# Patient Record
Sex: Male | Born: 1990 | Race: Black or African American | Hispanic: No | Marital: Single | Smoking: Current every day smoker
Health system: Southern US, Community
[De-identification: ages and names within clinical notes are randomized; demographics above are authoritative.]

## PROBLEM LIST (undated history)

## (undated) ENCOUNTER — Ambulatory Visit (HOSPITAL_COMMUNITY): Payer: Managed Care, Other (non HMO)

## (undated) DIAGNOSIS — I10 Essential (primary) hypertension: Secondary | ICD-10-CM

## (undated) DIAGNOSIS — B009 Herpesviral infection, unspecified: Secondary | ICD-10-CM

## (undated) DIAGNOSIS — F419 Anxiety disorder, unspecified: Secondary | ICD-10-CM

## (undated) DIAGNOSIS — F410 Panic disorder [episodic paroxysmal anxiety] without agoraphobia: Secondary | ICD-10-CM

## (undated) HISTORY — DX: Anxiety disorder, unspecified: F41.9

## (undated) HISTORY — DX: Essential (primary) hypertension: I10

---

## 2011-11-06 ENCOUNTER — Ambulatory Visit (INDEPENDENT_AMBULATORY_CARE_PROVIDER_SITE_OTHER): Payer: 59 | Admitting: Physician Assistant

## 2011-11-06 VITALS — BP 108/88 | HR 72 | Temp 97.7°F | Resp 16 | Ht 74.0 in | Wt 172.0 lb

## 2011-11-06 DIAGNOSIS — Z7251 High risk heterosexual behavior: Secondary | ICD-10-CM

## 2011-11-06 NOTE — Progress Notes (Signed)
  Subjective:    Patient ID: Joshua Silva, male    DOB: Mar 23, 1991, 21 y.o.   MRN: [id number]  HPI Pt presents to clinic for STD screening.  He requests a full STD panel.  He currently does not have a partner but recently had a new exposure that he was to make sure he is ok from.  He has had ~15 lifetime male partners and always uses condoms.  He is currently having no symptoms  Review of Systems     Objective:   Physical Exam  Constitutional: He is oriented to person, place, and time. He appears well-developed and well-nourished.  HENT:  Head: Normocephalic and atraumatic.  Right Ear: External ear normal.  Left Ear: External ear normal.  Nose: Nose normal.  Eyes: Conjunctivae are normal.  Pulmonary/Chest: Effort normal.  Neurological: He is alert and oriented to person, place, and time.  Skin: Skin is warm and dry.  Psychiatric: He has a normal mood and affect. His behavior is normal. Judgment and thought content normal.       Assessment & Plan:   1. Problems related to high-risk sexual behavior  Hepatitis C antibody, Hepatitis B surface antigen, Hepatitis B surface antibody, HIV antibody, RPR, HSV 2 antibody, IgG, GC/chlamydia probe amp, urine  Pt to continue with safe sex.  Will call him with results.

## 2011-11-07 LAB — HEPATITIS B SURFACE ANTIBODY, QUANTITATIVE: Hepatitis B-Post: 0 m[IU]/mL

## 2011-11-07 LAB — HEPATITIS C ANTIBODY: HCV Ab: NEGATIVE

## 2011-11-07 LAB — HEPATITIS B SURFACE ANTIGEN: Hepatitis B Surface Ag: NEGATIVE

## 2011-11-07 LAB — HIV ANTIBODY (ROUTINE TESTING W REFLEX): HIV: NONREACTIVE

## 2011-11-08 LAB — GC/CHLAMYDIA PROBE AMP, URINE: GC Probe Amp, Urine: NEGATIVE

## 2011-11-08 LAB — RPR

## 2011-11-09 ENCOUNTER — Other Ambulatory Visit: Payer: Self-pay | Admitting: Physician Assistant

## 2011-11-09 MED ORDER — AZITHROMYCIN 500 MG PO TABS: 1000.0000 mg | ORAL_TABLET | Freq: Every day | ORAL | Status: AC

## 2013-05-14 ENCOUNTER — Encounter (HOSPITAL_COMMUNITY): Payer: Self-pay | Admitting: Emergency Medicine

## 2013-05-14 ENCOUNTER — Emergency Department (HOSPITAL_COMMUNITY)
Admission: EM | Admit: 2013-05-14 | Discharge: 2013-05-14 | Disposition: A | Discharge status: Home / Self Care | Payer: 59 | Attending: Emergency Medicine | Admitting: Emergency Medicine

## 2013-05-14 DIAGNOSIS — R110 Nausea: Secondary | ICD-10-CM | POA: Insufficient documentation

## 2013-05-14 DIAGNOSIS — B009 Herpesviral infection, unspecified: Secondary | ICD-10-CM | POA: Insufficient documentation

## 2013-05-14 DIAGNOSIS — F17200 Nicotine dependence, unspecified, uncomplicated: Secondary | ICD-10-CM | POA: Insufficient documentation

## 2013-05-14 HISTORY — DX: Herpesviral infection, unspecified: B00.9

## 2013-05-14 LAB — CBC WITH DIFFERENTIAL/PLATELET
Basophils Absolute: 0 10*3/uL (ref 0.0–0.1)
Basophils Relative: 0 % (ref 0–1)
EOS ABS: 0.2 10*3/uL (ref 0.0–0.7)
EOS PCT: 5 % (ref 0–5)
HCT: 35.7 % — ABNORMAL LOW (ref 39.0–52.0)
HEMOGLOBIN: 11.8 g/dL — AB (ref 13.0–17.0)
LYMPHS ABS: 2.1 10*3/uL (ref 0.7–4.0)
Lymphocytes Relative: 52 % — ABNORMAL HIGH (ref 12–46)
MCH: 26.3 pg (ref 26.0–34.0)
MCHC: 33.1 g/dL (ref 30.0–36.0)
MCV: 79.5 fL (ref 78.0–100.0)
MONOS PCT: 5 % (ref 3–12)
Monocytes Absolute: 0.2 10*3/uL (ref 0.1–1.0)
Neutro Abs: 1.6 10*3/uL — ABNORMAL LOW (ref 1.7–7.7)
Neutrophils Relative %: 38 % — ABNORMAL LOW (ref 43–77)
Platelets: 158 10*3/uL (ref 150–400)
RBC: 4.49 MIL/uL (ref 4.22–5.81)
RDW: 13.2 % (ref 11.5–15.5)
WBC: 4.1 10*3/uL (ref 4.0–10.5)

## 2013-05-14 LAB — BASIC METABOLIC PANEL
BUN: 10 mg/dL (ref 6–23)
CALCIUM: 9.1 mg/dL (ref 8.4–10.5)
CO2: 24 meq/L (ref 19–32)
Chloride: 106 mEq/L (ref 96–112)
Creatinine, Ser: 0.94 mg/dL (ref 0.50–1.35)
GFR calc Af Amer: >90 mL/min (ref >90)
GLUCOSE: 90 mg/dL (ref 70–99)
Potassium: 3.5 mEq/L — ABNORMAL LOW (ref 3.7–5.3)
Sodium: 143 mEq/L (ref 137–147)

## 2013-05-14 MED ORDER — ONDANSETRON 4 MG PO TBDP
4.0000 mg | ORAL_TABLET | Freq: Once | ORAL | Status: AC
  Administered 2013-05-14: 4 mg via ORAL
  Filled 2013-05-14: qty 1

## 2013-05-14 NOTE — ED Notes (Signed)
Pt reports nausea x 1week. Pt reports that he believes this nausea is due to taking his herpes medications which he is unable to remember the names of at this time. NAD at this time.

## 2013-05-14 NOTE — Discharge Instructions (Signed)
Take zofran as needed for nausea. Refer to attached documents for more information. Return to the ED with worsening or concerning symptoms.  °

## 2013-05-14 NOTE — ED Provider Notes (Signed)
CSN: [id number]     Arrival date & time 05/14/13  0534 History   First MD Initiated Contact with Patient 05/14/13 [phone]     Chief Complaint  Patient presents with  . Nausea   (Consider location/radiation/quality/duration/timing/severity/associated sxs/prior Treatment) HPI Comments: Patient is a 23 year old male with a past medical history of herpes who presents with nausea for the past 2 weeks. Symptoms started gradually and remained intermittent since the onset. Patient reports thinking his Valtrex was making him feel nauseous. He stopped taking the medication 1 week ago and continues to report intermittent nausea. He reports having a zofran prescription at home but he has not taken any. No aggravating/alleviating factors. Patient reports normal bowel movement and denies vomiting. No other associated symptoms. Patient denies history of abdominal surgery.    Past Medical History  Diagnosis Date  . Herpes    History reviewed. No pertinent past surgical history. No family history on file. History  Substance Use Topics  . Smoking status: Current Every Day Smoker -- 0.25 packs/day    Types: Cigarettes  . Smokeless tobacco: Not on file  . Alcohol Use: No    Review of Systems  Constitutional: Negative for fever, chills and fatigue.  HENT: Negative for trouble swallowing.   Eyes: Negative for visual disturbance.  Respiratory: Negative for shortness of breath.   Cardiovascular: Negative for chest pain and palpitations.  Gastrointestinal: Positive for nausea. Negative for vomiting, abdominal pain and diarrhea.  Genitourinary: Negative for dysuria and difficulty urinating.  Musculoskeletal: Negative for arthralgias and neck pain.  Skin: Negative for color change.  Neurological: Negative for dizziness and weakness.  Psychiatric/Behavioral: Negative for dysphoric mood.    Allergies  Review of patient's allergies indicates no known allergies.  Home Medications   Current Outpatient Rx   Name  Route  Sig  Dispense  Refill  . doxycycline (VIBRA-TABS) 100 MG tablet   Oral   Take 100 mg by mouth daily as needed (for herbes outbreak).         . valACYclovir (VALTREX) 500 MG tablet   Oral   Take 500 mg by mouth daily as needed (for herbes outbreak).          BP 129/81  Pulse 56  Temp(Src) 98 F (36.7 C) (Oral)  Resp 18  Ht 6\' 2"  (1.88 m)  Wt 172 lb (78.019 kg)  BMI 22.07 kg/m2  SpO2 100% Physical Exam  Nursing note and vitals reviewed. Constitutional: He appears well-developed and well-nourished. No distress.  HENT:  Head: Normocephalic and atraumatic.  Eyes: Conjunctivae and EOM are normal.  Neck: Normal range of motion.  Cardiovascular: Normal rate and regular rhythm.  Exam reveals no gallop and no friction rub.   No murmur heard. Pulmonary/Chest: Effort normal and breath sounds normal. He has no wheezes. He has no rales. He exhibits no tenderness.  Abdominal: Soft. He exhibits no distension. There is no tenderness. There is no rebound and no guarding.  No abdominal tenderness to palpation.   Musculoskeletal: Normal range of motion.  Neurological: He is alert. Coordination normal.  Speech is goal-oriented. Moves limbs without ataxia.   Skin: Skin is warm and dry.  Psychiatric: He has a normal mood and affect. His behavior is normal.    ED Course  Procedures (including critical care time) Labs Review Labs Reviewed  CBC WITH DIFFERENTIAL - Abnormal; Notable for the following:    Hemoglobin 11.8 (*)    HCT 35.7 (*)    Neutrophils Relative %  38 (*)    Neutro Abs 1.6 (*)    Lymphocytes Relative 52 (*)    All other components within normal limits  BASIC METABOLIC PANEL - Abnormal; Notable for the following:    Potassium 3.5 (*)    All other components within normal limits   Imaging Review No results found.  EKG Interpretation   None       MDM   1. Nausea     7:43 AM Patient's labs unremarkable for acute changes. Patient has no  abdominal tenderness to palpation. Vitals stable and patient afebrile. I doubt acute infectious or life threatening etiology at this time. Patient reports zofran here made him feel better. Patient has a prescription for zofran at home. Patient advised to return with worsening or concerning symptoms.     Emilia Beck, [place] 05/14/13 [phone]

## 2013-05-14 NOTE — ED Provider Notes (Signed)
Medical screening examination/treatment/procedure(s) were performed by non-physician practitioner and as supervising physician I was immediately available for consultation/collaboration.  EKG Interpretation   None        Ethelda ChickMartha K Linker, MD 05/14/13 2300

## 2013-05-27 ENCOUNTER — Other Ambulatory Visit: Payer: Self-pay | Admitting: Family Medicine

## 2013-05-27 DIAGNOSIS — R109 Unspecified abdominal pain: Secondary | ICD-10-CM

## 2013-05-30 ENCOUNTER — Other Ambulatory Visit: Payer: Self-pay

## 2013-06-04 ENCOUNTER — Other Ambulatory Visit: Payer: Self-pay

## 2013-06-06 ENCOUNTER — Ambulatory Visit
Admission: RE | Admit: 2013-06-06 | Discharge: 2013-06-06 | Disposition: A | Discharge status: Home / Self Care | Payer: 59 | Source: Ambulatory Visit | Attending: Family Medicine | Admitting: Family Medicine

## 2013-06-06 DIAGNOSIS — R109 Unspecified abdominal pain: Secondary | ICD-10-CM

## 2013-06-06 MED ORDER — IOHEXOL 300 MG/ML  SOLN
100.0000 mL | Freq: Once | INTRAMUSCULAR | Status: AC | PRN
  Administered 2013-06-06: 100 mL via INTRAVENOUS

## 2013-06-07 ENCOUNTER — Encounter (HOSPITAL_COMMUNITY): Payer: Self-pay | Admitting: Emergency Medicine

## 2013-06-07 DIAGNOSIS — F17200 Nicotine dependence, unspecified, uncomplicated: Secondary | ICD-10-CM | POA: Insufficient documentation

## 2013-06-07 DIAGNOSIS — R110 Nausea: Secondary | ICD-10-CM | POA: Insufficient documentation

## 2013-06-07 MED ORDER — ONDANSETRON 4 MG PO TBDP
8.0000 mg | ORAL_TABLET | Freq: Once | ORAL | Status: AC
  Administered 2013-06-07: 8 mg via ORAL
  Filled 2013-06-07: qty 2

## 2013-06-07 NOTE — ED Notes (Addendum)
C/o nausea since the end of January.  States he has been seen for same and had negative CT scan.  Denies abd pain at present.  Denies vomiting.  States he is taking Omeprazole without relief of symptoms.  Pt 's discharge instructions stated to take Zofran for nausea but pt states he did not receive a Rx for Zofran.  Also taking medication for herpes that he believes may be causing the nausea.

## 2013-06-08 ENCOUNTER — Emergency Department (HOSPITAL_COMMUNITY)
Admission: EM | Admit: 2013-06-08 | Discharge: 2013-06-08 | Discharge status: Against Medical Advice | Payer: 59 | Attending: Emergency Medicine | Admitting: Emergency Medicine

## 2013-06-08 NOTE — ED Notes (Signed)
No answer in waiting room 

## 2014-02-27 IMAGING — CT CT ABD-PELV W/ CM
2 of 4 series · 17 of 46 positions shown, 19 images · IV contrast (omnipaque)
Comparison: None.

CLINICAL DATA: Mid abdominal pain for 2 weeks.  Nausea.

EXAM:
CT ABDOMEN AND PELVIS WITH CONTRAST
TECHNIQUE: Multidetector CT imaging of the abdomen and pelvis was performed
using the standard protocol following bolus administration of
intravenous contrast.
CONTRAST:  100mL OMNIPAQUE IOHEXOL 300 MG/ML  SOLN

[Series 2: abd/pelvis with · axial · 0.65mm/px · z∈[-446,-16]mm · 14 of 94 slices shown, 16 images]
[im 4/94  soft-tissue]
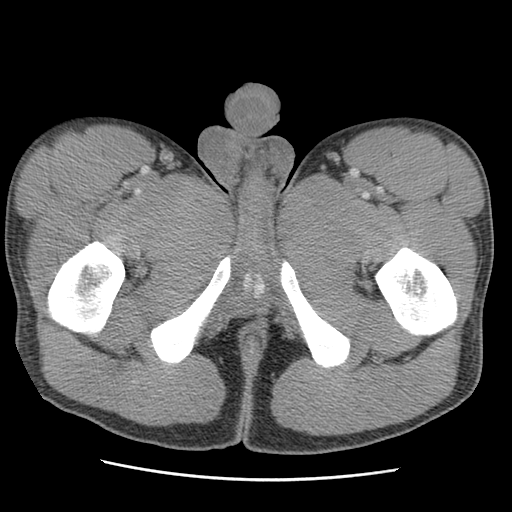
[im 4/94  bone]
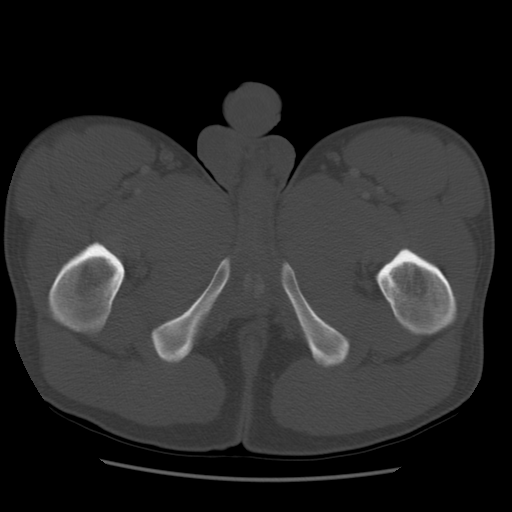
[im 12/94  soft-tissue]
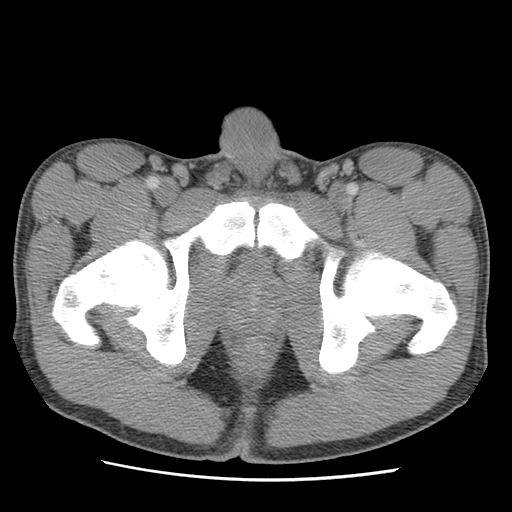
[im 19/94  soft-tissue]
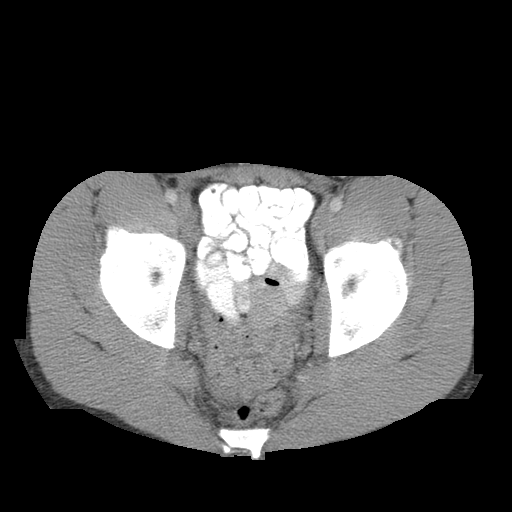
[im 27/94  soft-tissue]
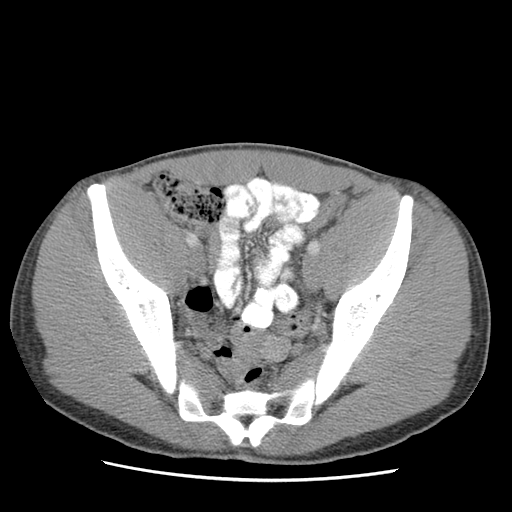
[im 30/94  soft-tissue]
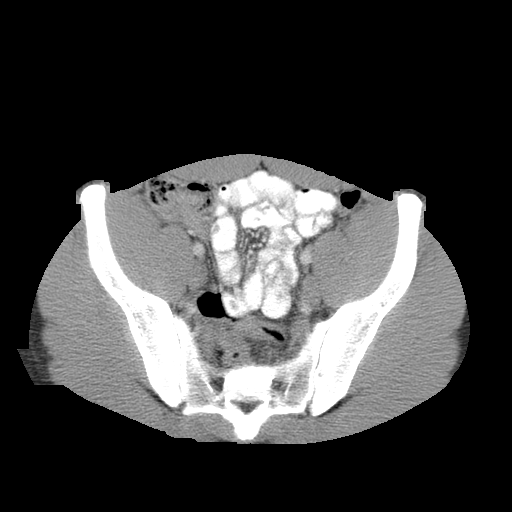
[im 38/94  soft-tissue]
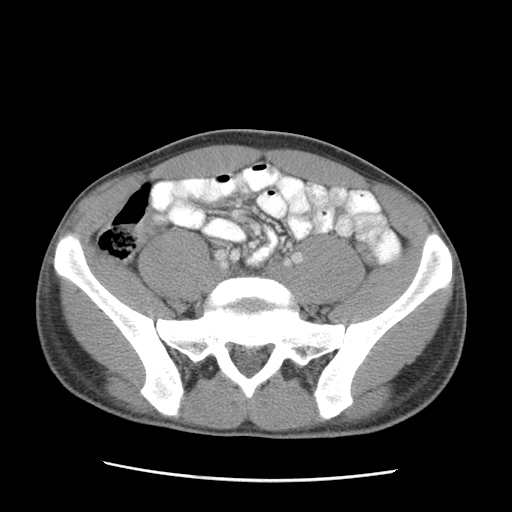
[im 45/94  soft-tissue]
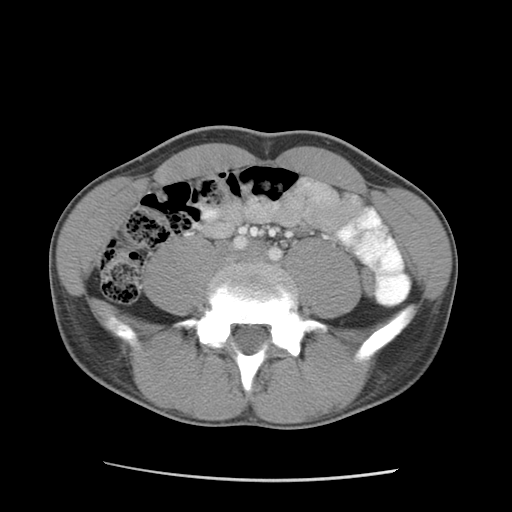
[im 49/94  soft-tissue]
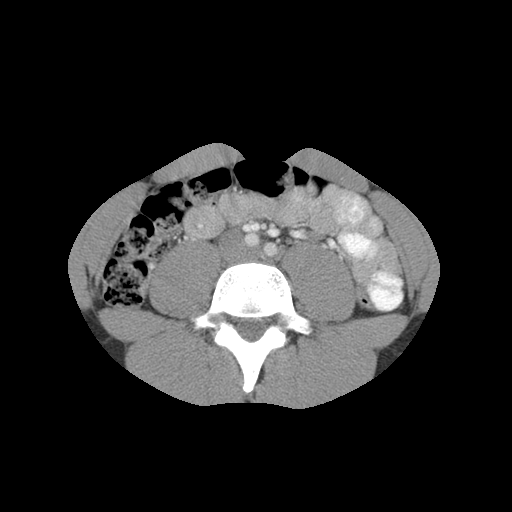
[im 56/94  soft-tissue]
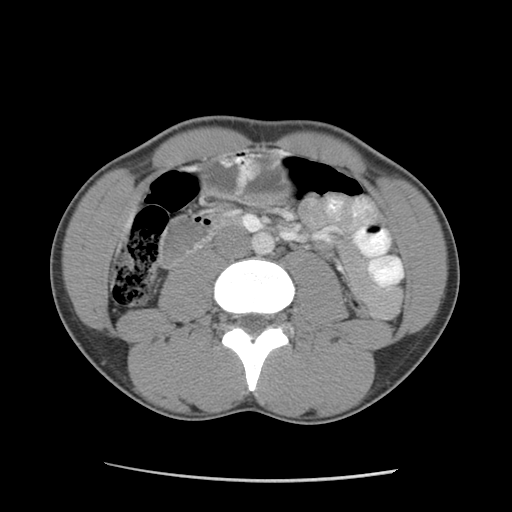
[im 56/94  bone]
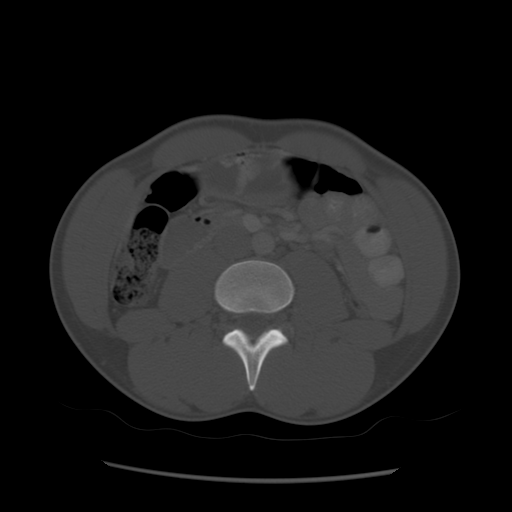
[im 64/94  soft-tissue]
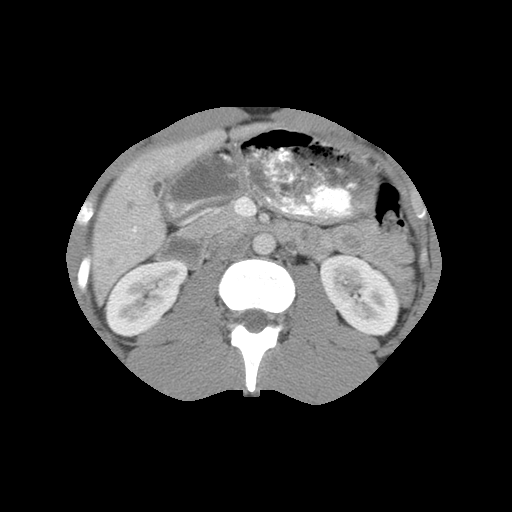
[im 71/94  soft-tissue]
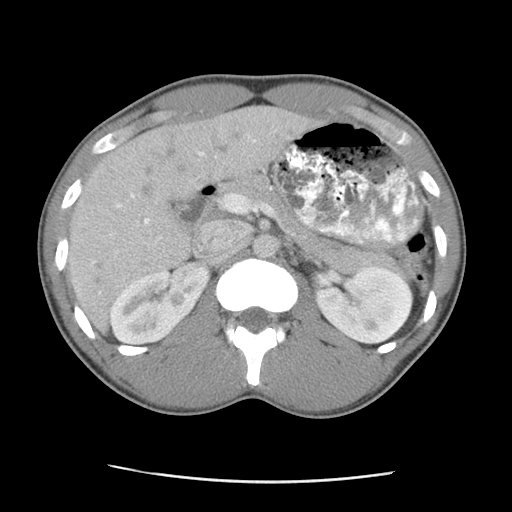
[im 75/94  soft-tissue]
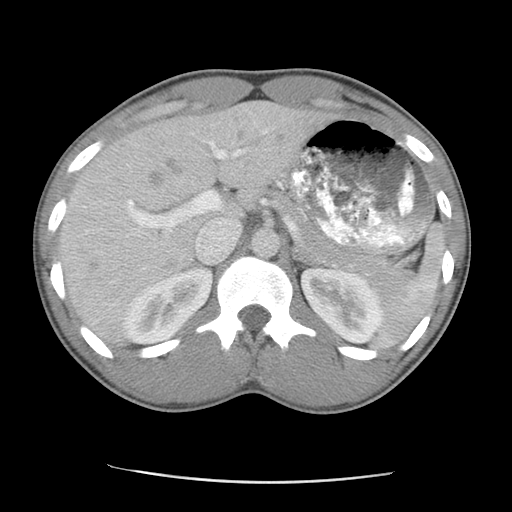
[im 82/94  soft-tissue]
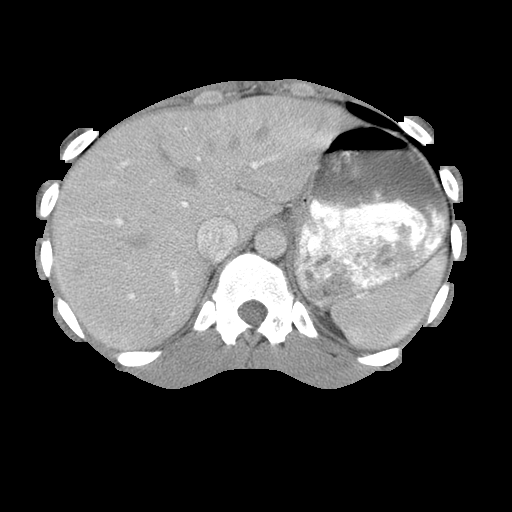
[im 90/94  soft-tissue]
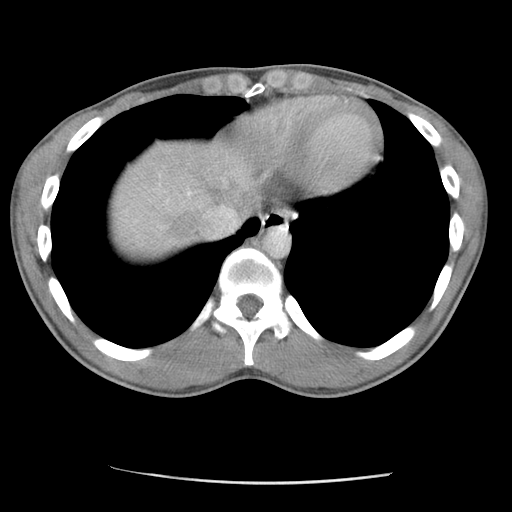

[Series 400: cor · coronal · 0.99mm/px · 3 of 116 slices shown]
[im 39/116  soft-tissue]
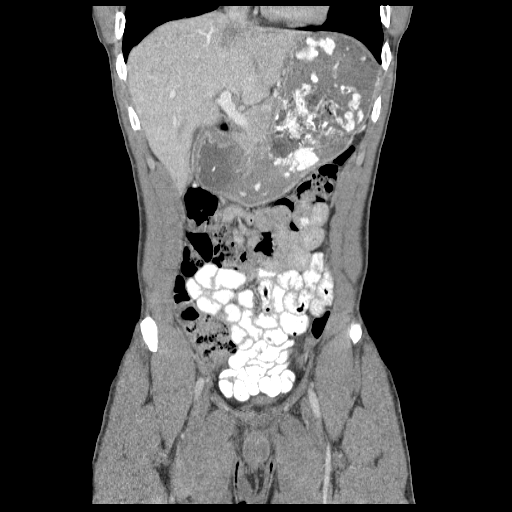
[im 52/116  soft-tissue]
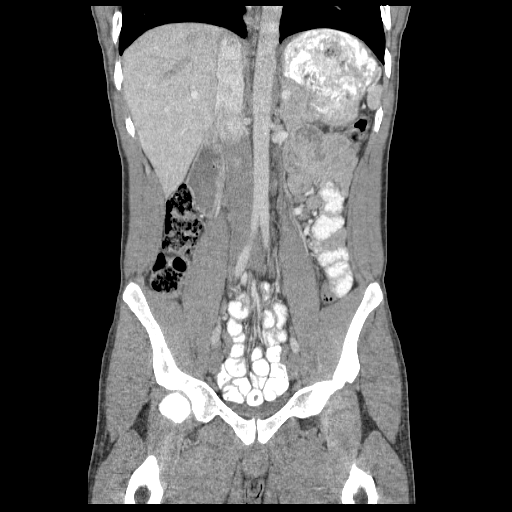
[im 64/116  soft-tissue]
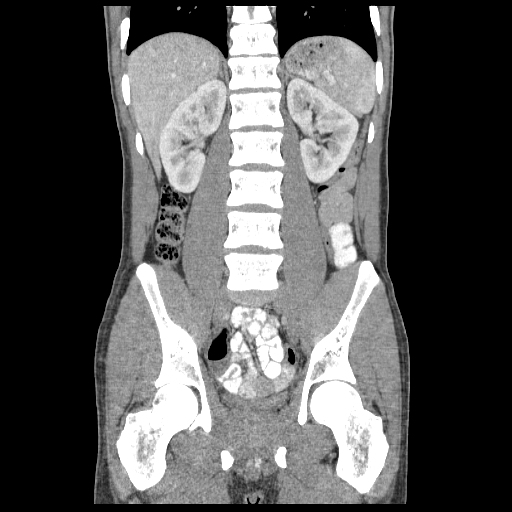

[17 of 46 positions shown; findings below may reference images not displayed]

FINDINGS: Clear lung bases.  The heart is normal in size.

Normal liver, spleen, gallbladder pancreas. No bile duct dilation.
Normal adrenal glands. Normal kidneys and ureters. The bladder is
mostly decompressed but otherwise unremarkable.

No pathologically enlarged lymph nodes. No abnormal fluid
collections.

Normal bowel. A normal appendix is not definitively seen but there
is no evidence of appendicitis.

No bony abnormality.
IMPRESSION: Normal enhanced CT scan the abdomen pelvis.

## 2018-08-24 ENCOUNTER — Emergency Department (HOSPITAL_COMMUNITY)
Admission: EM | Admit: 2018-08-24 | Discharge: 2018-08-24 | Disposition: A | Discharge status: Home / Self Care | Payer: PRIVATE HEALTH INSURANCE | Attending: Emergency Medicine | Admitting: Emergency Medicine

## 2018-08-24 ENCOUNTER — Telehealth: Payer: Self-pay | Admitting: Cardiology

## 2018-08-24 ENCOUNTER — Encounter (HOSPITAL_COMMUNITY): Payer: Self-pay | Admitting: *Deleted

## 2018-08-24 ENCOUNTER — Other Ambulatory Visit: Payer: Self-pay

## 2018-08-24 ENCOUNTER — Ambulatory Visit (INDEPENDENT_AMBULATORY_CARE_PROVIDER_SITE_OTHER)
Admission: EM | Admit: 2018-08-24 | Discharge: 2018-08-24 | Disposition: A | Discharge status: Home / Self Care | Payer: PRIVATE HEALTH INSURANCE | Source: Home / Self Care | Attending: Family Medicine | Admitting: Family Medicine

## 2018-08-24 ENCOUNTER — Emergency Department (HOSPITAL_COMMUNITY): Payer: PRIVATE HEALTH INSURANCE

## 2018-08-24 ENCOUNTER — Encounter (HOSPITAL_COMMUNITY): Payer: Self-pay

## 2018-08-24 ENCOUNTER — Encounter (HOSPITAL_COMMUNITY)
Admission: EM | Disposition: A | Discharge status: Home / Self Care | Payer: Self-pay | Source: Home / Self Care | Attending: Family Medicine

## 2018-08-24 DIAGNOSIS — R0789 Other chest pain: Secondary | ICD-10-CM | POA: Diagnosis not present

## 2018-08-24 DIAGNOSIS — R079 Chest pain, unspecified: Secondary | ICD-10-CM

## 2018-08-24 DIAGNOSIS — F1290 Cannabis use, unspecified, uncomplicated: Secondary | ICD-10-CM | POA: Insufficient documentation

## 2018-08-24 DIAGNOSIS — Z720 Tobacco use: Secondary | ICD-10-CM

## 2018-08-24 DIAGNOSIS — R072 Precordial pain: Secondary | ICD-10-CM

## 2018-08-24 DIAGNOSIS — R9431 Abnormal electrocardiogram [ECG] [EKG]: Secondary | ICD-10-CM

## 2018-08-24 HISTORY — DX: Panic disorder (episodic paroxysmal anxiety): F41.0

## 2018-08-24 LAB — BASIC METABOLIC PANEL
Anion gap: 13 (ref 5–15)
BUN: 8 mg/dL (ref 6–20)
CO2: 20 mmol/L — ABNORMAL LOW (ref 22–32)
Calcium: 9.2 mg/dL (ref 8.9–10.3)
Chloride: 107 mmol/L (ref 98–111)
Creatinine, Ser: 1.02 mg/dL (ref 0.61–1.24)
GFR calc Af Amer: >60 mL/min (ref >60)
GFR calc non Af Amer: >60 mL/min (ref >60)
Glucose, Bld: 87 mg/dL (ref 70–99)
Potassium: 4 mmol/L (ref 3.5–5.1)
Sodium: 140 mmol/L (ref 135–145)

## 2018-08-24 LAB — CBC
HCT: 44.2 % (ref 39.0–52.0)
Hemoglobin: 13.9 g/dL (ref 13.0–17.0)
MCH: 26.4 pg (ref 26.0–34.0)
MCHC: 31.4 g/dL (ref 30.0–36.0)
MCV: 84 fL (ref 80.0–100.0)
Platelets: 196 10*3/uL (ref 150–400)
RBC: 5.26 MIL/uL (ref 4.22–5.81)
RDW: 13.5 % (ref 11.5–15.5)
WBC: 4 10*3/uL (ref 4.0–10.5)
nRBC: 0 % (ref 0.0–0.2)

## 2018-08-24 LAB — D-DIMER, QUANTITATIVE: D-Dimer, Quant: <0.27 ug/mL-FEU (ref 0.00–0.50)

## 2018-08-24 LAB — TROPONIN I
Troponin I: <0.03 ng/mL (ref <0.03)
Troponin I: <0.03 ng/mL (ref <0.03)

## 2018-08-24 SURGERY — CORONARY/GRAFT ACUTE MI REVASCULARIZATION
Anesthesia: LOCAL

## 2018-08-24 MED ORDER — ASPIRIN 81 MG PO CHEW
324.0000 mg | CHEWABLE_TABLET | Freq: Once | ORAL | Status: AC
  Administered 2018-08-24: 324 mg via ORAL

## 2018-08-24 MED ORDER — KETOROLAC TROMETHAMINE 30 MG/ML IJ SOLN
30.0000 mg | Freq: Once | INTRAMUSCULAR | Status: AC
  Administered 2018-08-24: 12:00:00 30 mg via INTRAVENOUS
  Filled 2018-08-24: qty 1

## 2018-08-24 MED ORDER — PREDNISONE 10 MG (21) PO TBPK: ORAL_TABLET | ORAL | 0 refills | Status: DC

## 2018-08-24 MED ORDER — IBUPROFEN 600 MG PO TABS: 600.0000 mg | ORAL_TABLET | Freq: Four times a day (QID) | ORAL | 0 refills | Status: DC | PRN

## 2018-08-24 MED ORDER — ASPIRIN 81 MG PO CHEW
CHEWABLE_TABLET | ORAL | Status: AC
  Filled 2018-08-24: qty 4

## 2018-08-24 NOTE — ED Triage Notes (Signed)
Pt here via [facility] for r/o stemi.  STEMI cancelled at the bridge.  Pt denies sob.  VS stable.

## 2018-08-24 NOTE — Telephone Encounter (Signed)
New Message   Dr. Delton See stated Dr. Mayford Knife called her with the information so she doesn't need you to call her back now.

## 2018-08-24 NOTE — Consult Note (Signed)
Cardiology Consultation:   Patient ID: Joshua Silva MRN: [id number]; DOB: 1990/10/07  Admit date: 08/24/2018 Date of Consult: 08/24/2018  Primary Care Provider: Patient, No Pcp Per Primary Cardiologist: No primary care provider on file.  Primary Electrophysiologist:  None    Patient Profile:   Joshua Silva is a 28 y.o. male with a hx of seasonal allergies who is being seen today for the evaluation of chest pain at the request of Dr. Delton SeeNelson.  History of Present Illness:   Mr. Joshua Silva has had intermittent chest discomfort over the past 3 to 4 days.  It is worse when he takes a deep breath or stretches his shoulders.  It is not necessarily related to exertion.  He has had some left arm discomfort with the chest pain.  Today, he went to urgent care to get checked out and was found to have an abnormal ECG.  He was sent to the ER and prior to departure from urgent care, a code STEMI was activated.  There was no old ECG for comparison.  Upon arrival to the emergency room, the patient was anxious.  He had no shortness of breath.  He had minimal chest discomfort, worst when he took a deep breath.  ECG showed anterior ST changes with some inferior ST depression.  The pattern seemed most consistent with musculoskeletal chest pain and early repolarization on the ECG.  Past Medical History:  Diagnosis Date  . Herpes   . Panic attacks     History reviewed. No pertinent surgical history.   Home Medications:  Prior to Admission medications   Medication Sig Start Date End Date Taking? Authorizing Provider  valACYclovir (VALTREX) 500 MG tablet Take 500 mg by mouth daily as needed (for herpes outbreak).     [provider]    Inpatient Medications: Scheduled Meds:  Continuous Infusions:  PRN Meds:   Allergies:   No Known Allergies  Social History:   Social History   Socioeconomic History  . Marital status: Single    Spouse name: Not on file  . Number of children: Not on  file  . Years of education: Not on file  . Highest education level: Not on file  Occupational History  . Not on file  Social Needs  . Financial resource strain: Not on file  . Food insecurity:    Worry: Not on file    Inability: Not on file  . Transportation needs:    Medical: Not on file    Non-medical: Not on file  Tobacco Use  . Smoking status: Never Smoker  . Smokeless tobacco: Never Used  . Tobacco comment: "not tobacco"  Substance and Sexual Activity  . Alcohol use: Yes    Comment: occ  . Drug use: Yes    Types: Marijuana  . Sexual activity: Not on file  Lifestyle  . Physical activity:    Days per week: Not on file    Minutes per session: Not on file  . Stress: Not on file  Relationships  . Social connections:    Talks on phone: Not on file    Gets together: Not on file    Attends religious service: Not on file    Active member of club or organization: Not on file    Attends meetings of clubs or organizations: Not on file    Relationship status: Not on file  . Intimate partner violence:    Fear of current or ex partner: Not on file    Emotionally abused:  Not on file    Physically abused: Not on file    Forced sexual activity: Not on file  Other Topics Concern  . Not on file  Social History Narrative  . Not on file    Family History:   No family history on file. No CAD in parents.  ROS:  Please see the history of present illness.  Chest pain,  All other ROS reviewed and negative.     Physical Exam/Data:   Vitals:   08/24/18 1215 08/24/18 1230 08/24/18 1245 08/24/18 1300  BP: 111/84 132/79 (!) 130/93 132/85  Pulse:      Resp: 17 (!) 21 19 (!) 22  Temp:      TempSrc:      SpO2:      Weight:      Height:       No intake or output data in the 24 hours ending 08/24/18 1315 Last 3 Weights 08/24/2018 06/07/2013 05/14/2013  Weight (lbs) 176 lb 176 lb 4.8 oz 172 lb  Weight (kg) 79.833 kg 79.969 kg 78.019 kg     Body mass index is 22 kg/m.  General:   Well nourished, well developed, in no acute distress  HEENT: normal Lymph: no adenopathy Neck: no JVD Endocrine:  No thryomegaly Vascular: No carotid bruits; FA pulses 2+ bilaterally without bruits  Cardiac:  normal S1, S2; RRR; no murmur  Lungs:  clear to auscultation bilaterally, no wheezing, rhonchi or rales  Abd: soft, nontender, no hepatomegaly  Ext: no edema Musculoskeletal:  No deformities, BUE and BLE strength normal and equal Skin: warm and dry  Neuro:  CNs 2-12 intact, no focal abnormalities noted Psych:  Normal affect   EKG:  The EKG was personally reviewed and demonstrates: Normal sinus rhythm, anterior ST changes with some inferior ST depression Telemetry:  Telemetry was personally reviewed and demonstrates: Normal sinus rhythm  Relevant CV Studies: ECG  Laboratory Data:  Chemistry Recent Labs  Lab 08/24/18 1125  NA 140  K 4.0  CL 107  CO2 20*  GLUCOSE 87  BUN 8  CREATININE 1.02  CALCIUM 9.2  GFRNONAA >60  GFRAA >60  ANIONGAP 13    No results for input(s): PROT, ALBUMIN, AST, ALT, ALKPHOS, BILITOT in the last 168 hours. Hematology Recent Labs  Lab 08/24/18 1125  WBC 4.0  RBC 5.26  HGB 13.9  HCT 44.2  MCV 84.0  MCH 26.4  MCHC 31.4  RDW 13.5  PLT 196   Cardiac Enzymes Recent Labs  Lab 08/24/18 1125  TROPONINI <0.03   No results for input(s): TROPIPOC in the last 168 hours.  BNPNo results for input(s): BNP, PROBNP in the last 168 hours.  DDimer  Recent Labs  Lab 08/24/18 1136  DDIMER <0.27    Radiology/Studies:  Dg Chest Port 1 View  Result Date: 08/24/2018 CLINICAL DATA:  Left-sided chest pain EXAM: PORTABLE CHEST 1 VIEW COMPARISON:  None. FINDINGS: The heart size and mediastinal contours are within normal limits. Both lungs are clear. The visualized skeletal structures are unremarkable. IMPRESSION: No active disease. Electronically Signed   By: Alcide Clever M.D.   On: 08/24/2018 12:14    Assessment and Plan:   1. Precordial chest  pain/abnormal ECG: Chest pain is quite atypical for acute coronary syndrome.  I think he likely has early repolarization on the ECG.  We will cancel code STEMI.  Watch in the emergency room.  Check cardiac enzymes. 2. Of note, first troponin was negative. 3. Tobacco abuse: He  needs to stop smoking.  He has not smoked for a couple of days.  I encouraged him to continue to stop smoking tobacco.  He also smokes marijuana on occasion. 4. If he rules out for MI, would treat for musculoskeletal chest pain.      For questions or updates, please contact [facility] Please consult [email or web address] for contact info under     Signed, Lance Muss, MD  08/24/2018 1:15 PM

## 2018-08-24 NOTE — ED Notes (Signed)
Patient verbalizes understanding of discharge instructions. Opportunity for questioning and answers were provided. Armband removed by staff, pt discharged from ED.  

## 2018-08-24 NOTE — ED Provider Notes (Signed)
[facility]    CSN: [id number] Arrival date & time: 08/24/18  [id number]     History   Chief Complaint Chief Complaint  Patient presents with  . Chest Pain    HPI Joshua Silva is a 28 y.o. male.   HPI  Is a healthy 28 year old.  He is on no medication.  No history of any heart problems.  No history of GI problems or reflux.  No lung disease or asthma.  No recent illness or fever. He does not have any CAD risk factors, no hypertension no cigarette smoking no diabetes no high cholesterol.  He is not sedentary.  He is lean and muscular.  He states he does not have any family history of heart disease at a young age. He states for the last few days, 4-5, he has been having chest pain.  It is intermittent.  It appears to be random.  It is not associated with exertion.  Appetite bowels and digestion are normal.  He states that the pain radiates to his left arm causing an ache.  No diaphoresis, no dizziness, no palpitations, no shortness of breath.  He states sometimes the pain feels worse with a deep breath. He has not done any heavy lifting or repetitive activities.  Recalls no trauma to the chest.  Past Medical History:  Diagnosis Date  . Herpes     There are no active problems to display for this patient.   History reviewed. No pertinent surgical history.     Home Medications    Prior to Admission medications   Medication Sig Start Date End Date Taking? Authorizing Provider  valACYclovir (VALTREX) 500 MG tablet Take 500 mg by mouth daily as needed (for herbes outbreak).    [provider]    Family History No family history on file. States there is no family history of heart disease at a young age Social History Social History   Tobacco Use  . Smoking status: Never Smoker  . Smokeless tobacco: Never Used  . Tobacco comment: "not tobacco"  Substance Use Topics  . Alcohol use: No  . Drug use: No     Allergies   Patient has no known allergies.    Review of Systems Review of Systems  Constitutional: Negative for chills and fever.  HENT: Negative for ear pain and sore throat.   Eyes: Negative for pain and visual disturbance.  Respiratory: Negative for cough and shortness of breath.   Cardiovascular: Positive for chest pain. Negative for palpitations.  Gastrointestinal: Negative for abdominal pain and vomiting.  Genitourinary: Negative for dysuria and hematuria.  Musculoskeletal: Negative for arthralgias and back pain.  Skin: Negative for color change and rash.  Neurological: Negative for dizziness, seizures, syncope and headaches.  All other systems reviewed and are negative.    Physical Exam Triage Vital Signs ED Triage Vitals [08/24/18 0948]  Enc Vitals Group     BP (!) 133/91     Pulse Rate 89     Resp 18     Temp 98.3 F (36.8 C)     Temp Source Oral     SpO2 99 %     Weight      Height      Head Circumference      Peak Flow      Pain Score 2     Pain Loc      Pain Edu?      Excl. in GC?    No data found.  Updated Vital Signs BP (!) 133/91 (BP Location: Left Arm)   Pulse 89   Temp 98.3 F (36.8 C) (Oral)   Resp 18   SpO2 99%      Physical Exam Constitutional:      General: He is not in acute distress.    Appearance: He is well-developed.  HENT:     Head: Normocephalic and atraumatic.  Eyes:     Conjunctiva/sclera: Conjunctivae normal.     Pupils: Pupils are equal, round, and reactive to light.  Neck:     Musculoskeletal: Normal range of motion.  Cardiovascular:     Rate and Rhythm: Normal rate and regular rhythm.     Chest Wall: PMI is not displaced.     Heart sounds: Normal heart sounds. No murmur.  Pulmonary:     Effort: Pulmonary effort is normal. No respiratory distress.     Breath sounds: Normal breath sounds.  Abdominal:     General: There is no distension.     Palpations: Abdomen is soft.  Musculoskeletal: Normal range of motion.  Lymphadenopathy:     Cervical: No cervical  adenopathy.  Skin:    General: Skin is warm and dry.  Neurological:     General: No focal deficit present.     Mental Status: He is alert.  Psychiatric:        Mood and Affect: Mood normal.        Behavior: Behavior normal.   Normal physical examination   UC Treatments / Results  Labs (all labs ordered are listed, but only abnormal results are displayed) Labs Reviewed - No data to display  EKG-EKG shows ST and T wave changes suspicious for ST EMI.  Cardiology was called.  Dr. Carolanne Grumbling reviewed the EKG.  She recommends transfer to the emergency room by STEMI protocol None  Radiology No results found.  Procedures Procedures (including critical care time)  Medications Ordered in UC Medications - No data to display  Initial Impression / Assessment and Plan / UC Course  I have reviewed the triage vital signs and the nursing notes.  Pertinent labs & imaging results that were available during my care of the patient were reviewed by me and considered in my medical decision making (see chart for details).     Final Clinical Impressions(s) / UC Diagnoses   Final diagnoses:  Nonspecific abnormal electrocardiogram (ECG) (EKG)  Chest pain, unspecified type   Discharge Instructions   None    ED Prescriptions    None     Controlled Substance Prescriptions [place] Controlled Substance Registry consulted? Not Applicable   Eustace Moore, MD 08/24/18 1039

## 2018-08-24 NOTE — ED Notes (Signed)
[facility] at bedside for transport to ED. Pt placed on Zoll and O2. Pt educated on situation and going to Cath lab

## 2018-08-24 NOTE — Discharge Instructions (Signed)
Sent to ER

## 2018-08-24 NOTE — Telephone Encounter (Signed)
I received a call from a Dr Delton See urgent care with patient's EKG indicating STEMI.  I reached out to Dr Excell Seltzer, while she reached out to Dr Mayford Knife.  They decided to have the patient transported to the ED for further evaluation.

## 2018-08-24 NOTE — ED Triage Notes (Signed)
Pt c/o center to lt sided chest pain with lt arm numbness off and on for the past few week. Pt  States when stretching it feels like something is popping in center of chest and feels alittle relief

## 2018-08-24 NOTE — ED Provider Notes (Signed)
[facility] EMERGENCY DEPARTMENT Provider Note   CSN: [id number] Arrival date & time: 08/24/18  1101    History   Chief Complaint Chief Complaint  Patient presents with  . Chest Pain    HPI Joshua Silva is a 28 y.o. male.     Pt presents to the ED today with left sided cp.  The pt said the pain has been going on for about 2 weeks.  He denies sob or cough.  No f/c.  No known Covid contacts.  The pt went to urgent care with the CP and had an abnormal EKG.  The urgent care doctor spoke with Dr. Radford Pax (cardiology) who recommended sending pt to the ED for a code stemi.  The pt was met at the bridge by Dr. Irish Lack who cancelled the code stemi.  CP with deep inspiration.     Past Medical History:  Diagnosis Date  . Herpes   . Panic attacks     There are no active problems to display for this patient.   History reviewed. No pertinent surgical history.      Home Medications    Prior to Admission medications   Medication Sig Start Date End Date Taking? Authorizing Provider  albuterol (VENTOLIN HFA) 108 (90 Base) MCG/ACT inhaler Inhale 2 puffs into the lungs every 6 (six) hours as needed for wheezing or shortness of breath.   Yes [provider]  ibuprofen (ADVIL) 600 MG tablet Take 1 tablet (600 mg total) by mouth every 6 (six) hours as needed. 08/24/18   Isla Pence, MD  predniSONE (STERAPRED UNI-PAK 21 TAB) 10 MG (21) TBPK tablet Take 6 tabs for 2 days, then 5 for 2 days, then 4 for 2 days, then 3 for 2 days, 2 for 2 days, then 1 for 2 days 08/24/18   Isla Pence, MD    Family History No family history on file.  Social History Social History   Tobacco Use  . Smoking status: Never Smoker  . Smokeless tobacco: Never Used  . Tobacco comment: "not tobacco"  Substance Use Topics  . Alcohol use: Yes    Comment: occ  . Drug use: Yes    Types: Marijuana     Allergies   Patient has no known allergies.   Review of Systems  Review of Systems  Cardiovascular: Positive for chest pain.  All other systems reviewed and are negative.    Physical Exam Updated Vital Signs BP 122/74   Pulse 74   Temp 98.2 F (36.8 C) (Oral)   Resp 19   Ht _0  (1.905 m)   Wt 79.8 kg   SpO2 98%   BMI 22.00 kg/m   Physical Exam Vitals signs and nursing note reviewed.  Constitutional:      Appearance: He is well-developed and normal weight.  HENT:     Head: Normocephalic and atraumatic.  Eyes:     Extraocular Movements: Extraocular movements intact.     Pupils: Pupils are equal, round, and reactive to light.  Neck:     Musculoskeletal: Normal range of motion and neck supple.  Cardiovascular:     Rate and Rhythm: Normal rate and regular rhythm.     Heart sounds: Normal heart sounds.  Pulmonary:     Effort: Pulmonary effort is normal.     Breath sounds: Normal breath sounds.  Chest:    Abdominal:     General: Bowel sounds are normal.     Palpations: Abdomen is soft.  Musculoskeletal: Normal range of motion.  Skin:    General: Skin is warm.     Capillary Refill: Capillary refill takes less than 2 seconds.  Neurological:     General: No focal deficit present.     Mental Status: He is alert and oriented to person, place, and time.  Psychiatric:        Mood and Affect: Mood normal.        Behavior: Behavior normal.      ED Treatments / Results  Labs (all labs ordered are listed, but only abnormal results are displayed) Labs Reviewed  BASIC METABOLIC PANEL - Abnormal; Notable for the following components:      Result Value   CO2 20 (*)    All other components within normal limits  CBC  TROPONIN I  D-DIMER, QUANTITATIVE (NOT AT [facility])  TROPONIN I    EKG EKG Interpretation  Date/Time:  Friday Aug 24 2018 11:21:37 EDT Ventricular Rate:  69 PR Interval:    QRS Duration: 85 QT Interval:  376 QTC Calculation: 403 R Axis:   82 Text Interpretation:  Sinus rhythm Probable left atrial enlargement  Extensive anterior infarct, acute (LAD) Confirmed by Isla Pence [phone]) on 08/24/2018 11:31:15 AM   Radiology Dg Chest Port 1 View  Result Date: 08/24/2018 CLINICAL DATA:  Left-sided chest pain EXAM: PORTABLE CHEST 1 VIEW COMPARISON:  None. FINDINGS: The heart size and mediastinal contours are within normal limits. Both lungs are clear. The visualized skeletal structures are unremarkable. IMPRESSION: No active disease. Electronically Signed   By: Inez Catalina M.D.   On: 08/24/2018 12:14    Procedures Procedures (including critical care time)  Medications Ordered in ED Medications  ketorolac (TORADOL) 30 MG/ML injection 30 mg (30 mg Intravenous Given 08/24/18 1138)     Initial Impression / Assessment and Plan / ED Course  I have reviewed the triage vital signs and the nursing notes.  Pertinent labs & imaging results that were available during my care of the patient were reviewed by me and considered in my medical decision making (see chart for details).       CP is completely gone after Toradol.  His ekg is likely early repol.  2 neg troponins with cp going on for 2 weeks is unlikely to be CAD.  Pt is stable for d/c.  Return if worse.  Final Clinical Impressions(s) / ED Diagnoses   Final diagnoses:  Atypical chest pain    ED Discharge Orders         Ordered    predniSONE (STERAPRED UNI-PAK 21 TAB) 10 MG (21) TBPK tablet     08/24/18 1442    ibuprofen (ADVIL) 600 MG tablet  Every 6 hours PRN     08/24/18 1442           Isla Pence, MD 08/24/18 1522

## 2018-08-24 NOTE — ED Notes (Signed)
Per Dr. Delton See, Cardiology requests to call Code STEMI. [facility] called, STEMI activated and transportation on the way. Report given to [facility] in the ER and Arlys John in the cathlab.

## 2019-05-17 IMAGING — DX PORTABLE CHEST - 1 VIEW
2 series · 2 of 2 positions shown · non-contrast
Comparison: None.

CLINICAL DATA: Left-sided chest pain

EXAM:
PORTABLE CHEST 1 VIEW

[chest ap (1 of 2)]
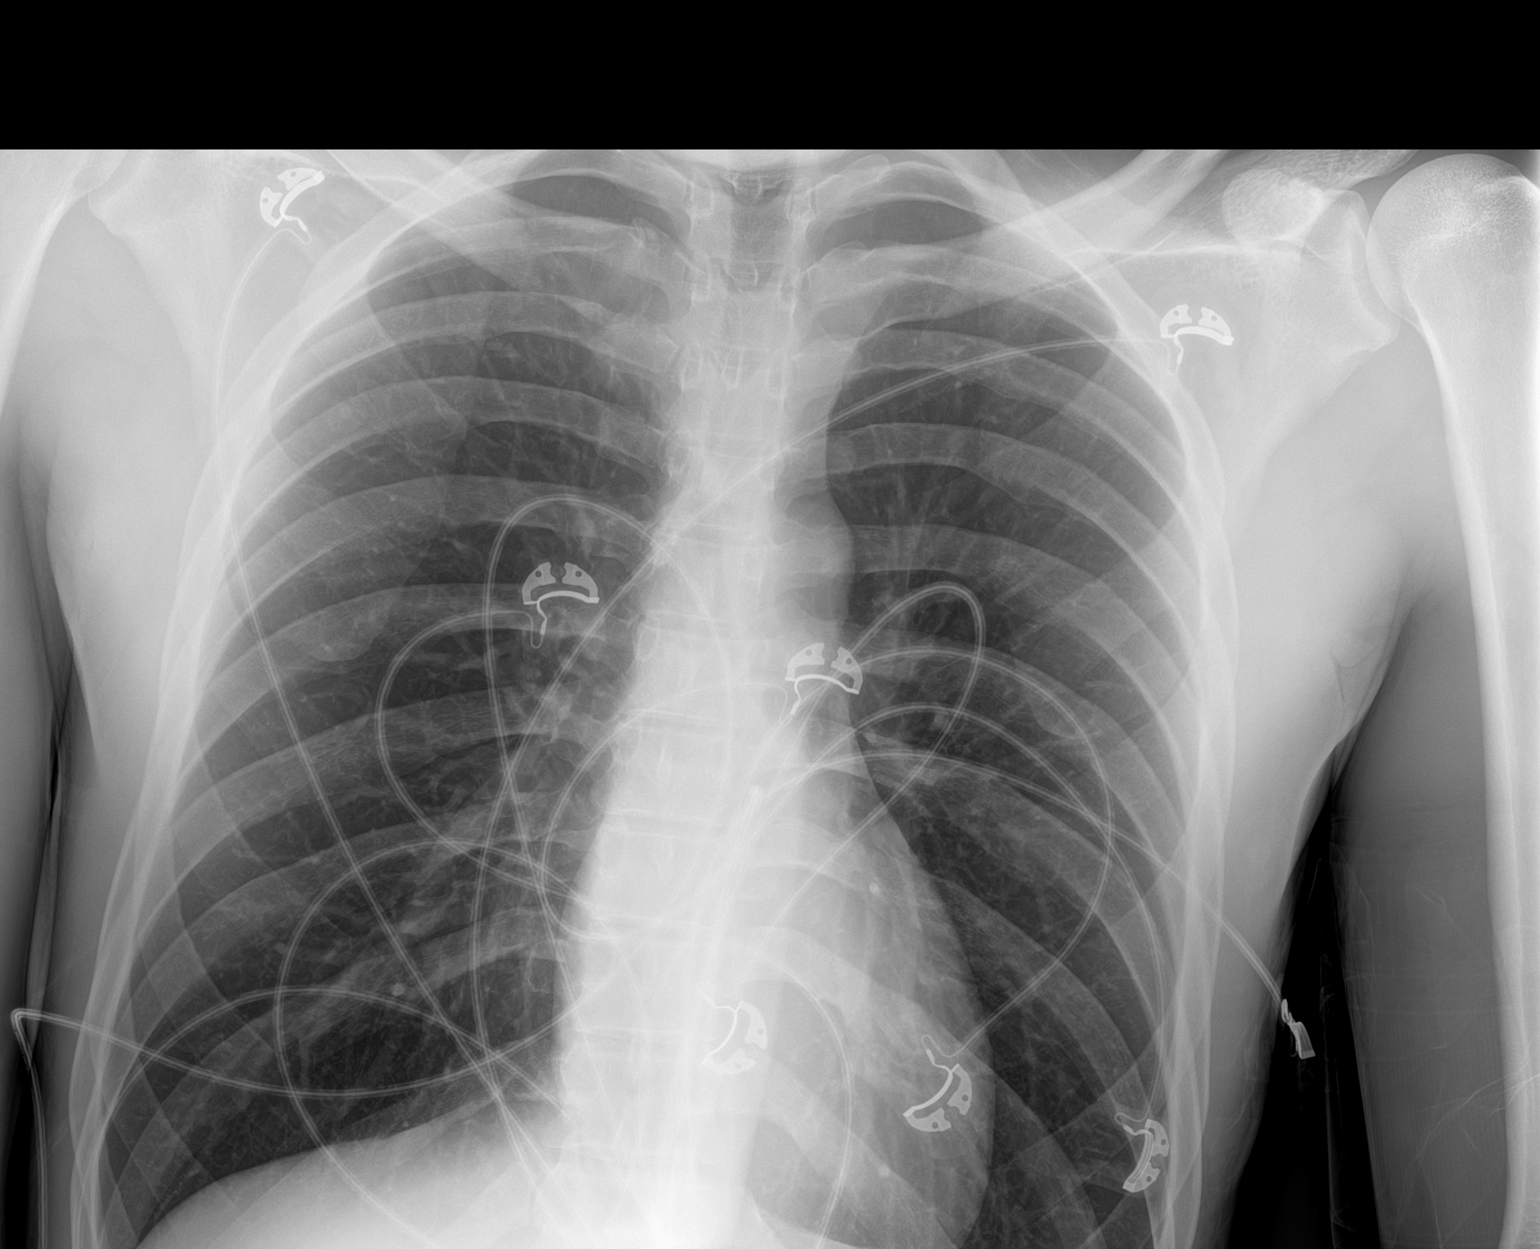

[chest ap (2 of 2)]
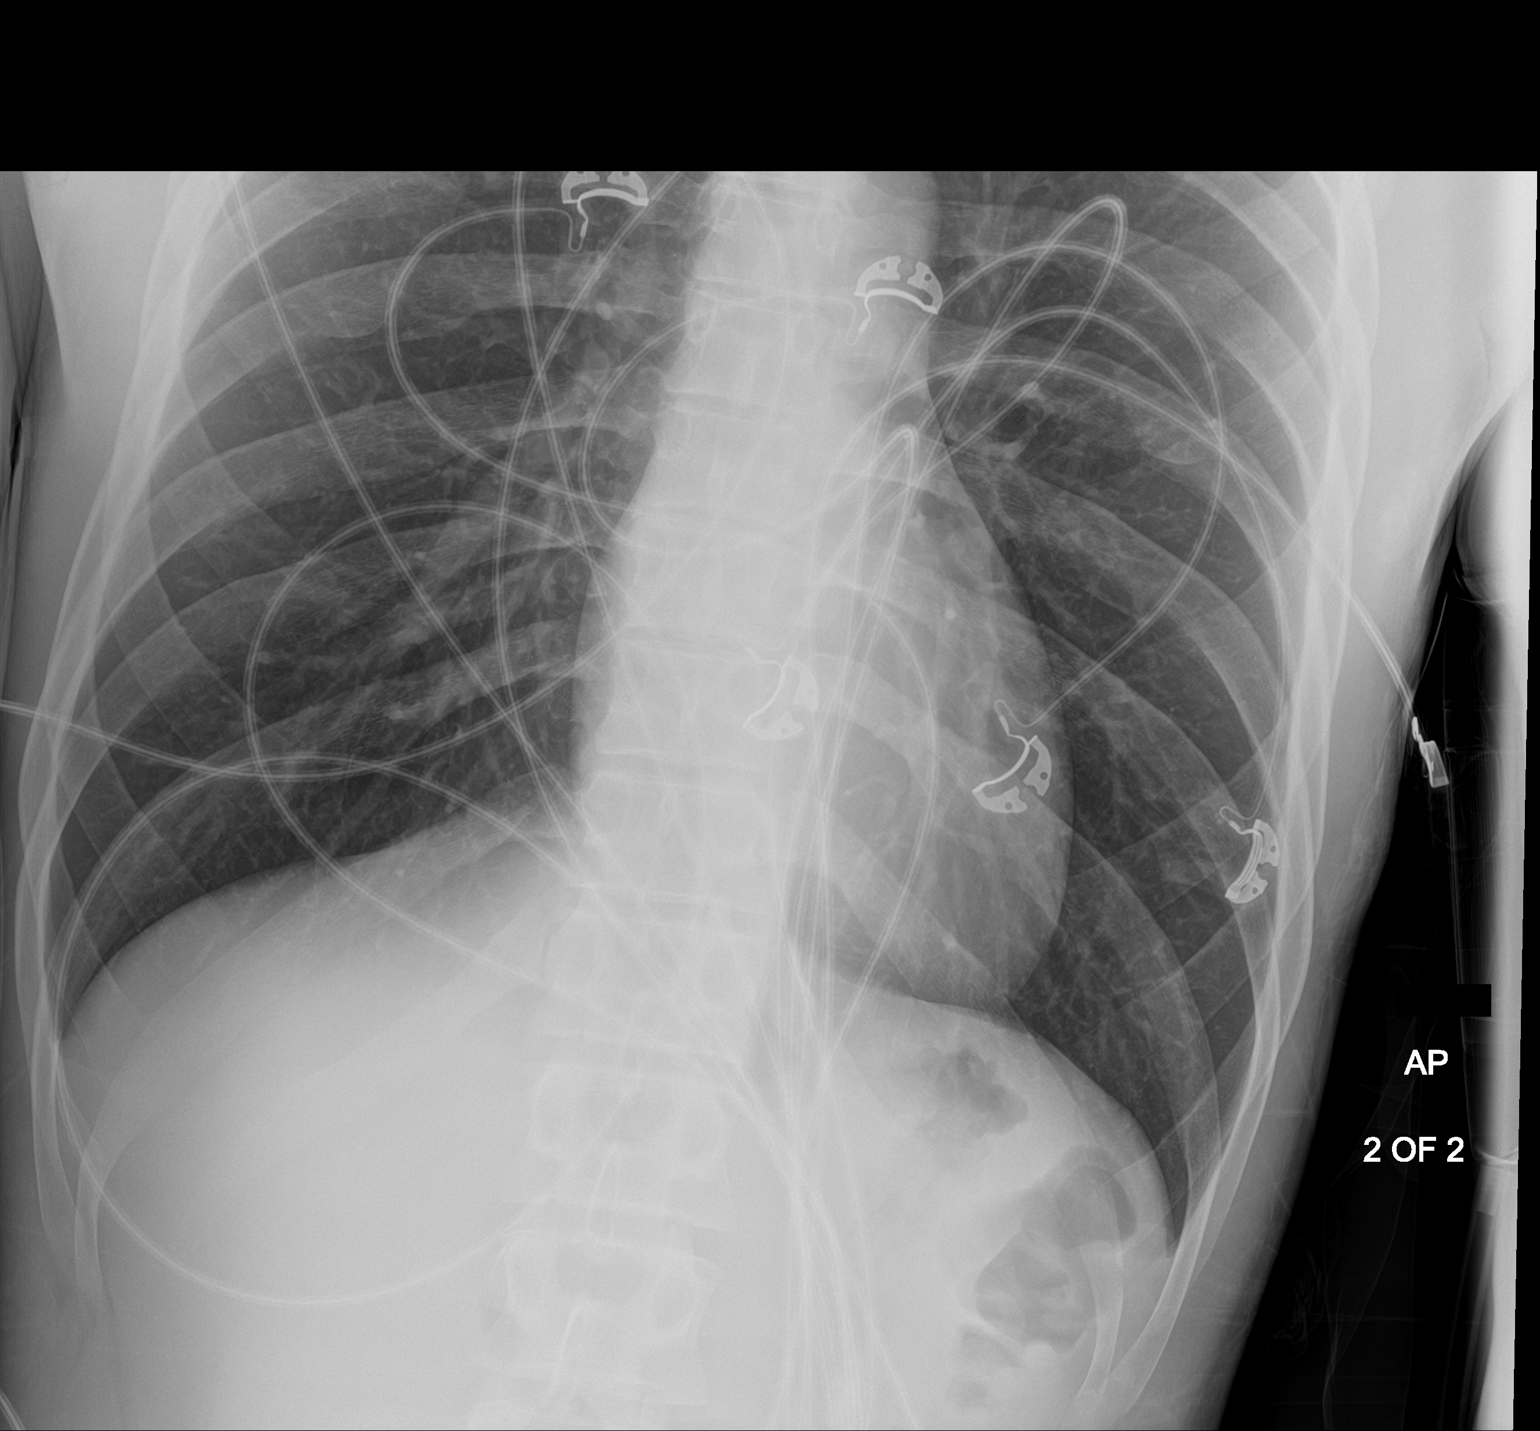

[2 of 2 positions shown; findings below may reference images not displayed]

FINDINGS: The heart size and mediastinal contours are within normal limits.
Both lungs are clear. The visualized skeletal structures are
unremarkable.
IMPRESSION: No active disease.

## 2019-05-22 ENCOUNTER — Ambulatory Visit (HOSPITAL_COMMUNITY)
Admission: EM | Admit: 2019-05-22 | Discharge: 2019-05-22 | Disposition: A | Discharge status: Home / Self Care | Payer: Managed Care, Other (non HMO) | Attending: Family Medicine | Admitting: Family Medicine

## 2019-05-22 ENCOUNTER — Other Ambulatory Visit: Payer: Self-pay

## 2019-05-22 ENCOUNTER — Encounter (HOSPITAL_COMMUNITY): Payer: Self-pay | Admitting: Emergency Medicine

## 2019-05-22 DIAGNOSIS — R1084 Generalized abdominal pain: Secondary | ICD-10-CM | POA: Diagnosis not present

## 2019-05-22 DIAGNOSIS — M898X1 Other specified disorders of bone, shoulder: Secondary | ICD-10-CM | POA: Diagnosis not present

## 2019-05-22 MED ORDER — PREDNISONE 5 MG PO TABS: ORAL_TABLET | ORAL | 0 refills | Status: DC

## 2019-05-22 NOTE — ED Triage Notes (Signed)
PT was seen in May for chest pain and sent to ER. Reports left sided chest pain, back pain (worse with physical pressure/ movement). Pain over "ribs and abs."   Does a lot of lifting at work.

## 2019-05-22 NOTE — ED Provider Notes (Signed)
[place]    CSN: [id number] Arrival date & time: 05/22/19  1726      History   Chief Complaint Chief Complaint  Patient presents with  . Chest Pain  . Back Pain    HPI Joshua Silva is a 29 y.o. male.   He is presenting with periscapular pain on the left side as well as a generalized abdominal pain.  The symptoms been ongoing for a couple of weeks.  He denies any specific inciting event.  He is having normal voids and bowel movements.  No trauma to the abdomen.  No fevers or chills.  No history of abdominal surgery.  Abdominal pain is intermittent and inconsistent.  Does not seem to be associated with any specific trigger or inciting event.  Make it worse for the course the day.  Has been taking ibuprofen and that seems to improve the symptoms.  Periscapular pain is occurring over the inferior border of the left scapula.  He does lift a lot for his job.  No specific inciting event  HPI  Past Medical History:  Diagnosis Date  . Herpes   . Panic attacks     There are no problems to display for this patient.   History reviewed. No pertinent surgical history.     Home Medications    Prior to Admission medications   Medication Sig Start Date End Date Taking? Authorizing Provider  ibuprofen (ADVIL) 600 MG tablet Take 1 tablet (600 mg total) by mouth every 6 (six) hours as needed. 08/24/18  Yes Isla Pence, MD  albuterol (VENTOLIN HFA) 108 (90 Base) MCG/ACT inhaler Inhale 2 puffs into the lungs every 6 (six) hours as needed for wheezing or shortness of breath.    [provider]  predniSONE (DELTASONE) 5 MG tablet Take 6 pills for first day, 5 pills second day, 4 pills third day, 3 pills fourth day, 2 pills the fifth day, and 1 pill sixth day. 05/22/19   Rosemarie Ax, MD    Family History No family history on file.  Social History Social History   Tobacco Use  . Smoking status: Current Every Day Smoker    Packs/day: 0.25  . Smokeless  tobacco: Never Used  . Tobacco comment: "not tobacco"  Substance Use Topics  . Alcohol use: Yes    Comment: occ  . Drug use: Yes    Types: Marijuana     Allergies   Patient has no known allergies.   Review of Systems Review of Systems See HPI  Physical Exam Triage Vital Signs ED Triage Vitals [05/22/19 1843]  Enc Vitals Group     BP      Pulse      Resp      Temp      Temp src      SpO2      Weight      Height      Head Circumference      Peak Flow      Pain Score 6     Pain Loc      Pain Edu?      Excl. in [place]?    No data found.  Updated Vital Signs BP (!) 168/94   Pulse 76   Temp 97.7 F (36.5 C) (Oral)   Resp 16   SpO2 99%   Visual Acuity Right Eye Distance:   Left Eye Distance:   Bilateral Distance:    Right Eye Near:   Left Eye Near:  Bilateral Near:     Physical Exam Gen: NAD, alert, cooperative with exam, well-appearing ENT: normal lips, normal nasal mucosa,  Eye: normal EOM, normal conjunctiva and lids CV:  no edema, +2 pedal pulses   Resp: no accessory muscle use, non-labored,  GI: no masses, soft, nondistended, positive bowel sounds, tenderness around the periumbilical region, no hernia Skin: no rashes, no areas of induration  Neuro: normal tone, normal sensation to touch Psych:  normal insight, alert and oriented MSK:  Left scapula. No winging of the scapula. Normal shoulder range of motion. Some tenderness to the inferior border of the scapula. No midline thoracic tenderness. Neurovascular intact   UC Treatments / Results  Labs (all labs ordered are listed, but only abnormal results are displayed) Labs Reviewed - No data to display  EKG   Radiology No results found.  Procedures Procedures (including critical care time)  Medications Ordered in UC Medications - No data to display  Initial Impression / Assessment and Plan / UC Course  I have reviewed the triage vital signs and the nursing notes.  Pertinent labs &  imaging results that were available during my care of the patient were reviewed by me and considered in my medical decision making (see chart for details).     Joshua Silva a 29 year old male is presenting with periscapular pain and generalized abdominal pain.  Abdominal pain could be related to viral gastroenteritis.  Not likely for appendicitis or nephrolithiasis.  No signs of obstruction or urinary complaints.  Back pain is likely myofascial in nature with trigger points.  Right a prednisone.  Counseled on supportive care.  Given indications to follow-up and seek immediate care.  Final Clinical Impressions(s) / UC Diagnoses   Final diagnoses:  Periscapular pain  Generalized abdominal pain     Discharge Instructions     Please try the exercises  Please try a thera cane for the back  Please follow up if your symptoms fail to improve  Please be seen in the emergency department if your symptoms worsen.     ED Prescriptions    Medication Sig Dispense Auth. Provider   predniSONE (DELTASONE) 5 MG tablet Take 6 pills for first day, 5 pills second day, 4 pills third day, 3 pills fourth day, 2 pills the fifth day, and 1 pill sixth day. 21 tablet Myra Rude, MD     PDMP not reviewed this encounter.   Myra Rude, MD 05/22/19 [phone]

## 2019-05-22 NOTE — Discharge Instructions (Signed)
Please try the exercises  Please try a thera cane for the back  Please follow up if your symptoms fail to improve  Please be seen in the emergency department if your symptoms worsen.

## 2019-08-22 ENCOUNTER — Other Ambulatory Visit: Payer: Self-pay | Admitting: Physician Assistant

## 2019-08-22 ENCOUNTER — Ambulatory Visit
Admission: RE | Admit: 2019-08-22 | Discharge: 2019-08-22 | Disposition: A | Discharge status: Home / Self Care | Payer: Managed Care, Other (non HMO) | Source: Ambulatory Visit | Attending: Physician Assistant | Admitting: Physician Assistant

## 2019-08-22 DIAGNOSIS — R0789 Other chest pain: Secondary | ICD-10-CM

## 2019-08-22 DIAGNOSIS — Z720 Tobacco use: Secondary | ICD-10-CM

## 2021-10-05 ENCOUNTER — Ambulatory Visit (HOSPITAL_COMMUNITY): Payer: Self-pay

## 2021-10-10 ENCOUNTER — Ambulatory Visit (HOSPITAL_COMMUNITY)
Admission: EM | Admit: 2021-10-10 | Discharge: 2021-10-10 | Disposition: A | Discharge status: Home / Self Care | Payer: Managed Care, Other (non HMO) | Attending: Emergency Medicine | Admitting: Emergency Medicine

## 2021-10-10 ENCOUNTER — Encounter (HOSPITAL_COMMUNITY): Payer: Self-pay

## 2021-10-10 DIAGNOSIS — M7918 Myalgia, other site: Secondary | ICD-10-CM | POA: Diagnosis not present

## 2021-10-10 MED ORDER — CYCLOBENZAPRINE HCL 10 MG PO TABS: 10.0000 mg | ORAL_TABLET | Freq: Two times a day (BID) | ORAL | 0 refills | Status: DC | PRN

## 2021-10-10 MED ORDER — IBUPROFEN 800 MG PO TABS
800.0000 mg | ORAL_TABLET | Freq: Once | ORAL | Status: AC
  Administered 2021-10-10: 800 mg via ORAL

## 2021-10-10 MED ORDER — IBUPROFEN 800 MG PO TABS
ORAL_TABLET | ORAL | Status: AC
  Filled 2021-10-10: qty 1

## 2021-10-10 NOTE — ED Triage Notes (Signed)
Pt reports left neck pain and back pain x 2- weeks. Pt does report heavy lifting at this job.

## 2021-10-10 NOTE — Discharge Instructions (Addendum)
Take ibuprofen every 6 hours for pain. You can take the muscle relaxer, Flexeril, twice daily. If this medicine makes you drowsy, take only at bedtime.  Try gentle neck and back stretches. You can apply hot pad to the sore areas.  Please go to the emergency department if symptoms worsen.

## 2021-10-10 NOTE — ED Provider Notes (Signed)
[facility]    CSN: [id number] Arrival date & time: 10/10/21  1633     History   Chief Complaint Chief Complaint  Patient presents with   Neck Pain    HPI Joshua Silva is a 31 y.o. male.  Presents with 2 week history of neck and back pain. Neck pain is on the left, does not radiate down the arm or back. No neck stiffness. Back pain is lumbar region, does not radiate down the legs. Denies bladder or bowel incontinence, no saddle anesthesia. Denies urinary symptoms, no blood in urine.  He has tried a single dose of muscle relaxer for his symptoms.  Reports heavy lifting at his job and may have injured it.  No recent illness. Denies trauma to the back. No history of back issues. Denies fever/chills, headache, vision changes, congestion, cough, sore throat, chest pain, shortness of breath, abdominal pain, vomiting/diarrhea, rash, weakness, numbness/tingling.   Past Medical History:  Diagnosis Date   Herpes    Panic attacks     There are no problems to display for this patient.   History reviewed. No pertinent surgical history.     Home Medications    Prior to Admission medications   Medication Sig Start Date End Date Taking? Authorizing Provider  cyclobenzaprine (FLEXERIL) 10 MG tablet Take 1 tablet (10 mg total) by mouth 2 (two) times daily as needed for muscle spasms. 10/10/21  Yes Arihaan Bellucci, Lurena Joiner, PA-C  albuterol (VENTOLIN HFA) 108 (90 Base) MCG/ACT inhaler Inhale 2 puffs into the lungs every 6 (six) hours as needed for wheezing or shortness of breath.    [provider]  ibuprofen (ADVIL) 600 MG tablet Take 1 tablet (600 mg total) by mouth every 6 (six) hours as needed. 08/24/18   Jacalyn Lefevre, MD    Family History History reviewed. No pertinent family history.  Social History Social History   Tobacco Use   Smoking status: Every Day    Packs/day: 0.25    Types: Cigarettes   Smokeless tobacco: Never   Tobacco comments:    "not tobacco"   Vaping Use   Vaping Use: Never used  Substance Use Topics   Alcohol use: Yes    Comment: occ   Drug use: Yes    Types: Marijuana     Allergies   Patient has no known allergies.   Review of Systems Review of Systems  Musculoskeletal:  Positive for neck pain.   Per HPI  Physical Exam Triage Vital Signs ED Triage Vitals  Enc Vitals Group     BP 10/10/21 1647 133/80     Pulse Rate 10/10/21 1647 (!) 106     Resp 10/10/21 1647 16     Temp 10/10/21 1647 98.4 F (36.9 C)     Temp Source 10/10/21 1647 Oral     SpO2 10/10/21 1647 96 %     Weight --      Height --      Head Circumference --      Peak Flow --      Pain Score 10/10/21 1648 7     Pain Loc --      Pain Edu? --      Excl. in [place]? --    No data found.  Updated Vital Signs BP 133/80 (BP Location: Left Arm)   Pulse (!) 106   Temp 98.4 F (36.9 C) (Oral)   Resp 16   SpO2 96%     Physical Exam Vitals and nursing note  reviewed.  Constitutional:      General: He is not in acute distress. HENT:     Nose: Nose normal.     Mouth/Throat:     Mouth: Mucous membranes are moist.     Pharynx: Oropharynx is clear.  Eyes:     Extraocular Movements: Extraocular movements intact.     Conjunctiva/sclera: Conjunctivae normal.     Pupils: Pupils are equal, round, and reactive to light.  Cardiovascular:     Rate and Rhythm: Normal rate and regular rhythm.     Heart sounds: Normal heart sounds.  Pulmonary:     Effort: Pulmonary effort is normal.     Breath sounds: Normal breath sounds.  Abdominal:     Palpations: Abdomen is soft.     Tenderness: There is no abdominal tenderness. There is no right CVA tenderness or left CVA tenderness.  Musculoskeletal:        General: Normal range of motion.  Neurological:     Mental Status: He is alert and oriented to person, place, and time.     Cranial Nerves: Cranial nerves 2-12 are intact.     Sensory: Sensation is intact.     Motor: Motor function is intact. No weakness  or pronator drift.     Coordination: Coordination is intact. Romberg sign negative.     Gait: Gait is intact.     Deep Tendon Reflexes: Reflexes are normal and symmetric.     Comments: Strength 5/5 all extremities     UC Treatments / Results  Labs (all labs ordered are listed, but only abnormal results are displayed) Labs Reviewed - No data to display  EKG   Radiology No results found.  Procedures Procedures (including critical care time)  Medications Ordered in UC Medications  ibuprofen (ADVIL) tablet 800 mg (has no administration in time range)    Initial Impression / Assessment and Plan / UC Course  I have reviewed the triage vital signs and the nursing notes.  Pertinent labs & imaging results that were available during my care of the patient were reviewed by me and considered in my medical decision making (see chart for details).  Exam unremarkable. No red flag symptoms. Most likely musculoskeletal from heavy lifting. Neck and back stretches, ibuprofen, hot pad. Work note provided for couple days off to rest and recover. Strict return precautions and emergency department if symptoms worsen.  Final Clinical Impressions(s) / UC Diagnoses   Final diagnoses:  Musculoskeletal pain     Discharge Instructions      Take ibuprofen every 6 hours for pain. You can take the muscle relaxer, Flexeril, twice daily. If this medicine makes you drowsy, take only at bedtime.  Try gentle neck and back stretches. You can apply hot pad to the sore areas.  Please go to the emergency department if symptoms worsen.     ED Prescriptions     Medication Sig Dispense Auth. Provider   cyclobenzaprine (FLEXERIL) 10 MG tablet Take 1 tablet (10 mg total) by mouth 2 (two) times daily as needed for muscle spasms. 20 tablet Issabelle Mcraney, Lurena Joiner, PA-C      PDMP not reviewed this encounter.   Qamar Aughenbaugh, Ray Church 10/10/21 1807

## 2021-10-25 ENCOUNTER — Ambulatory Visit (HOSPITAL_COMMUNITY)
Admission: EM | Admit: 2021-10-25 | Discharge: 2021-10-25 | Disposition: A | Discharge status: Home / Self Care | Payer: Managed Care, Other (non HMO) | Attending: Family Medicine | Admitting: Family Medicine

## 2021-10-25 ENCOUNTER — Encounter (HOSPITAL_COMMUNITY): Payer: Self-pay

## 2021-10-25 DIAGNOSIS — L249 Irritant contact dermatitis, unspecified cause: Secondary | ICD-10-CM

## 2021-10-25 MED ORDER — TRIAMCINOLONE ACETONIDE 0.1 % EX CREA: 1.0000 | TOPICAL_CREAM | Freq: Two times a day (BID) | CUTANEOUS | 0 refills | Status: DC | PRN

## 2021-10-25 MED ORDER — DEXAMETHASONE SODIUM PHOSPHATE 10 MG/ML IJ SOLN
10.0000 mg | Freq: Once | INTRAMUSCULAR | Status: AC
  Administered 2021-10-25: 10 mg via INTRAMUSCULAR

## 2021-10-25 MED ORDER — DEXAMETHASONE SODIUM PHOSPHATE 10 MG/ML IJ SOLN
INTRAMUSCULAR | Status: AC
  Filled 2021-10-25: qty 1

## 2021-10-25 NOTE — Discharge Instructions (Addendum)
Resume daily cetirizine for at least 5 to 7 days to prevent recurrent eruption of rash.  Apply triamcinolone cream twice daily as needed to rash.  Recommend discontinue use of any new soaps or detergents that may have caused symptoms to flare.

## 2021-10-25 NOTE — ED Provider Notes (Signed)
[facility]    CSN: [id number] Arrival date & time: 10/25/21  1709      History   Chief Complaint Chief Complaint  Patient presents with   Rash    HPI Joshua Silva is a 31 y.o. male.   HPI Patient presents today for evaluation of a itchy hive-like rash that developed upon awakening this morning.  Patient reports he went to work and the rash has developed now on his arms, back and neck.  He denies any history of contact dermatitis.  He does have outdoor allergies and occasionally takes cetirizine but is not currently taking it at present.  Denies any known contact with any known irritant however endorses recently change in soaps.  He is not having any difficulty swallowing. Past Medical History:  Diagnosis Date   Herpes    Panic attacks     There are no problems to display for this patient.   History reviewed. No pertinent surgical history.     Home Medications    Prior to Admission medications   Medication Sig Start Date End Date Taking? Authorizing Provider  triamcinolone cream (KENALOG) 0.1 % Apply 1 Application topically 2 (two) times daily as needed. 10/25/21  Yes Bing Neighbors, FNP  albuterol (VENTOLIN HFA) 108 (90 Base) MCG/ACT inhaler Inhale 2 puffs into the lungs every 6 (six) hours as needed for wheezing or shortness of breath.    [provider]  cyclobenzaprine (FLEXERIL) 10 MG tablet Take 1 tablet (10 mg total) by mouth 2 (two) times daily as needed for muscle spasms. 10/10/21   Rising, Lurena Joiner, PA-C  ibuprofen (ADVIL) 600 MG tablet Take 1 tablet (600 mg total) by mouth every 6 (six) hours as needed. 08/24/18   Jacalyn Lefevre, MD    Family History History reviewed. No pertinent family history.  Social History Social History   Tobacco Use   Smoking status: Every Day    Packs/day: 0.25    Types: Cigarettes   Smokeless tobacco: Never   Tobacco comments:    "not tobacco"  Vaping Use   Vaping Use: Never used  Substance Use  Topics   Alcohol use: Yes    Comment: occ   Drug use: Yes    Types: Marijuana     Allergies   Patient has no known allergies.   Review of Systems Review of Systems Pertinent negatives listed in HPI  Physical Exam Triage Vital Signs ED Triage Vitals  Enc Vitals Group     BP 10/25/21 1908 (!) 163/95     Pulse Rate 10/25/21 1908 84     Resp 10/25/21 1908 18     Temp 10/25/21 1908 98.4 F (36.9 C)     Temp Source 10/25/21 1908 Oral     SpO2 10/25/21 1908 100 %     Weight --      Height --      Head Circumference --      Peak Flow --      Pain Score 10/25/21 1909 0     Pain Loc --      Pain Edu? --      Excl. in [place]? --    No data found.  Updated Vital Signs BP (!) 163/95 (BP Location: Left Arm)   Pulse 84   Temp 98.4 F (36.9 C) (Oral)   Resp 18   SpO2 100%   Visual Acuity Right Eye Distance:   Left Eye Distance:   Bilateral Distance:    Right Eye  Near:   Left Eye Near:    Bilateral Near:     Physical Exam Constitutional:      Appearance: Normal appearance.  HENT:     Head: Normocephalic and atraumatic.  Eyes:     Extraocular Movements: Extraocular movements intact.     Pupils: Pupils are equal, round, and reactive to light.  Cardiovascular:     Rate and Rhythm: Normal rate and regular rhythm.  Pulmonary:     Effort: Pulmonary effort is normal.     Breath sounds: Normal breath sounds.  Musculoskeletal:        General: Normal range of motion.     Cervical back: Normal range of motion and neck supple.  Skin:    General: Skin is warm and dry.     Capillary Refill: Capillary refill takes less than 2 seconds.     Findings: Rash present. Rash is macular.  Neurological:     General: No focal deficit present.     Mental Status: He is alert and oriented to person, place, and time.  Psychiatric:        Mood and Affect: Mood normal.        Behavior: Behavior normal.     UC Treatments / Results  Labs (all labs ordered are listed, but only abnormal  results are displayed) Labs Reviewed - No data to display  EKG   Radiology No results found.  Procedures Procedures (including critical care time)  Medications Ordered in UC Medications  dexamethasone (DECADRON) injection 10 mg (10 mg Intramuscular Given 10/25/21 1927)    Initial Impression / Assessment and Plan / UC Course  I have reviewed the triage vital signs and the nursing notes.  Pertinent labs & imaging results that were available during my care of the patient were reviewed by me and considered in my medical decision making (see chart for details).    Irritant contact dermatitis Decadron 10 mg IM given here in clinic. Patient will resume Zyrtec daily for the next 5 to 7 days or until symptoms resolve.   Topical triamcinolone cream given for acute itching. Advised of allergy testing if symptoms recur. Patient verbalized understanding and agreement with plan. Final Clinical Impressions(s) / UC Diagnoses   Final diagnoses:  Irritant contact dermatitis, unspecified trigger     Discharge Instructions      Resume daily cetirizine for at least 5 to 7 days to prevent recurrent eruption of rash.  Apply triamcinolone cream twice daily as needed to rash.  Recommend discontinue use of any new soaps or detergents that may have caused symptoms to flare.     ED Prescriptions     Medication Sig Dispense Auth. Provider   triamcinolone cream (KENALOG) 0.1 % Apply 1 Application topically 2 (two) times daily as needed. 80 g Bing Neighbors, FNP      PDMP not reviewed this encounter.   Bing Neighbors, FNP 10/25/21 1954

## 2021-10-25 NOTE — ED Triage Notes (Signed)
Pt states woke up this am with a raised itchy rash all over. States started using a  new soap a week ago. Denies taking meds.

## 2022-02-20 ENCOUNTER — Ambulatory Visit (HOSPITAL_COMMUNITY)
Admission: EM | Admit: 2022-02-20 | Discharge: 2022-02-20 | Disposition: A | Discharge status: Home / Self Care | Payer: Managed Care, Other (non HMO) | Attending: Physician Assistant | Admitting: Physician Assistant

## 2022-02-20 ENCOUNTER — Encounter (HOSPITAL_COMMUNITY): Payer: Self-pay

## 2022-02-20 DIAGNOSIS — R0789 Other chest pain: Secondary | ICD-10-CM | POA: Diagnosis not present

## 2022-02-20 DIAGNOSIS — K219 Gastro-esophageal reflux disease without esophagitis: Secondary | ICD-10-CM

## 2022-02-20 DIAGNOSIS — R1013 Epigastric pain: Secondary | ICD-10-CM

## 2022-02-20 DIAGNOSIS — I10 Essential (primary) hypertension: Secondary | ICD-10-CM

## 2022-02-20 MED ORDER — LISINOPRIL 10 MG PO TABS: 10.0000 mg | ORAL_TABLET | Freq: Every day | ORAL | 2 refills | Status: DC

## 2022-02-20 MED ORDER — OMEPRAZOLE 20 MG PO CPDR: 20.0000 mg | DELAYED_RELEASE_CAPSULE | Freq: Two times a day (BID) | ORAL | 0 refills | Status: DC

## 2022-02-20 NOTE — Discharge Instructions (Signed)
Advised to try and decrease smoking as this affects the indigestion heartburn and also respiratory symptoms. Advised start lisinopril 10 mg once daily to help control blood pressure. Advised to start Prilosec 20 mg twice daily to help control indigestion and heartburn and this will also reduce the reflux.  Advised to go online and typing www.: [email or web address] this will bring up a screen to read he can choose primary care.  Once you click on primary care this will bring up different practices that she can choose from that is close to your residence to become established with a primary care physician.  Advised to return to urgent care as needed.

## 2022-02-20 NOTE — ED Provider Notes (Signed)
[facility]    CSN: [id number] Arrival date & time: 02/20/22  1632      History   Chief Complaint Chief Complaint  Patient presents with   Chest Pain   Abdominal Pain    HPI Joshua Silva is a 31 y.o. male.   31 year old male presents with abdominal pain and right anterior chest wall pain.  Patient indicates for the past several days he has been having some intermittent right lower quadrant pain and discomfort.  He indicates that it is crampy in nature and is localized around the right lower quadrant.  Patient indicates he does not have any nausea or vomiting or fever with the symptoms.  Patient does indicate that he has had some indigestion, heartburn, with reflux that has been ongoing over the past several weeks and has gotten worse.  Patient also indicates that over the past couple days he has been having some right anterior chest wall discomfort and pain that is worse when he lifts.  Patient indicates he did not traumatize the area but he does do a lot of lifting in his job.  Patient indicates he is not having any shortness of breath, or wheezing.  Patient does indicate that he smokes marijuana on a regular basis and he considers himself a heavy smoker.  Patient indicates that he does not smoke cigarettes.  Patient indicates he is under some increased stress, pressure and anxiety over the past couple weeks.  Patient also relates that he has a family history of having high blood pressure and knows that his blood pressure has been up the last several times has been to a medical office.  He is concerned about the high blood pressures and ask if he should be on treatment.  Patient does not have a PCP.   Chest Pain Associated symptoms: abdominal pain   Abdominal Pain Associated symptoms: chest pain (right sided chest wall pain)     Past Medical History:  Diagnosis Date   Herpes    Panic attacks     There are no problems to display for this patient.   History  reviewed. No pertinent surgical history.     Home Medications    Prior to Admission medications   Medication Sig Start Date End Date Taking? Authorizing Provider  lisinopril (ZESTRIL) 10 MG tablet Take 1 tablet (10 mg total) by mouth daily. 02/20/22  Yes Ellsworth Lennox, PA-C  omeprazole (PRILOSEC) 20 MG capsule Take 1 capsule (20 mg total) by mouth 2 (two) times daily before a meal. 02/20/22  Yes Ellsworth Lennox, PA-C  albuterol (VENTOLIN HFA) 108 (90 Base) MCG/ACT inhaler Inhale 2 puffs into the lungs every 6 (six) hours as needed for wheezing or shortness of breath.    [provider]  cyclobenzaprine (FLEXERIL) 10 MG tablet Take 1 tablet (10 mg total) by mouth 2 (two) times daily as needed for muscle spasms. 10/10/21   Rising, Lurena Joiner, PA-C  ibuprofen (ADVIL) 600 MG tablet Take 1 tablet (600 mg total) by mouth every 6 (six) hours as needed. 08/24/18   Jacalyn Lefevre, MD  triamcinolone cream (KENALOG) 0.1 % Apply 1 Application topically 2 (two) times daily as needed. 10/25/21   Bing Neighbors, FNP    Family History History reviewed. No pertinent family history.  Social History Social History   Tobacco Use   Smoking status: Every Day    Packs/day: 0.25    Types: Cigarettes   Smokeless tobacco: Never   Tobacco comments:    "not  tobacco"  Vaping Use   Vaping Use: Never used  Substance Use Topics   Alcohol use: Yes    Comment: occ   Drug use: Yes    Types: Marijuana     Allergies   Patient has no known allergies.   Review of Systems Review of Systems  Cardiovascular:  Positive for chest pain (right sided chest wall pain).  Gastrointestinal:  Positive for abdominal pain.     Physical Exam Triage Vital Signs ED Triage Vitals [02/20/22 1725]  Enc Vitals Group     BP (!) 189/125     Pulse Rate 64     Resp 18     Temp 98.5 F (36.9 C)     Temp Source Oral     SpO2 98 %     Weight      Height      Head Circumference      Peak Flow      Pain Score       Pain Loc      Pain Edu?      Excl. in [place]?    No data found.  Updated Vital Signs BP (!) 189/125 (BP Location: Left Arm)   Pulse 64   Temp 98.5 F (36.9 C) (Oral)   Resp 18   SpO2 98%   Visual Acuity Right Eye Distance:   Left Eye Distance:   Bilateral Distance:    Right Eye Near:   Left Eye Near:    Bilateral Near:     Physical Exam Constitutional:      Appearance: He is well-developed.  Cardiovascular:     Rate and Rhythm: Normal rate and regular rhythm.     Heart sounds: Normal heart sounds.  Pulmonary:     Effort: Pulmonary effort is normal.     Breath sounds: Normal breath sounds and air entry. No wheezing, rhonchi or rales.  Abdominal:     General: Abdomen is flat. Bowel sounds are normal.     Palpations: Abdomen is soft.     Tenderness: There is abdominal tenderness (mild). There is no guarding or rebound.  Neurological:     Mental Status: He is alert.      UC Treatments / Results  Labs (all labs ordered are listed, but only abnormal results are displayed) Labs Reviewed - No data to display  EKG: No acute changes as compared with EKG on 24 Aug 2018.   Radiology No results found.  Procedures Procedures (including critical care time)  Medications Ordered in UC Medications - No data to display  Initial Impression / Assessment and Plan / UC Course  I have reviewed the triage vital signs and the nursing notes.  Pertinent labs & imaging results that were available during my care of the patient were reviewed by me and considered in my medical decision making (see chart for details).    Plan: 1.  The chest wall pain will be treated with the following: A.  Prilosec 20 mg twice daily to help control pain secondary to reflux. 2.  The dyspepsia will be treated with the following: A.  Prilosec 20 mg twice a day to help control indigestion and heartburn. 3.  The reflux will be treated with the following: A.  Prilosec 20 mg twice daily to help control  indigestion or reflux. 4.  The essential hypertension will be treated with the following: A.  Lisinopril 10 mg once daily to help control blood pressure. 5.  Patient has been advised to  avoid fried and spicy foods for the next couple weeks, patient has also been advised to reduce the amount of smoking he does per day. 6.  Patient is asked to become established with a PCP and is given information on logging onto [email or web address] to become established. Final Clinical Impressions(s) / UC Diagnoses   Final diagnoses:  Chest wall pain  Dyspepsia  Gastroesophageal reflux disease, unspecified whether esophagitis present  Essential hypertension     Discharge Instructions      Advised to try and decrease smoking as this affects the indigestion heartburn and also respiratory symptoms. Advised start lisinopril 10 mg once daily to help control blood pressure. Advised to start Prilosec 20 mg twice daily to help control indigestion and heartburn and this will also reduce the reflux.  Advised to go online and typing www.: [email or web address] this will bring up a screen to read he can choose primary care.  Once you click on primary care this will bring up different practices that she can choose from that is close to your residence to become established with a primary care physician.  Advised to return to urgent care as needed.     ED Prescriptions     Medication Sig Dispense Auth. Provider   omeprazole (PRILOSEC) 20 MG capsule Take 1 capsule (20 mg total) by mouth 2 (two) times daily before a meal. 60 capsule Ellsworth Lennox, PA-C   lisinopril (ZESTRIL) 10 MG tablet Take 1 tablet (10 mg total) by mouth daily. 30 tablet Ellsworth Lennox, PA-C      PDMP not reviewed this encounter.   Ellsworth Lennox, PA-C 02/20/22 1808

## 2022-02-20 NOTE — ED Triage Notes (Signed)
Pt presents to office for abdominal pain that started yesterday. Pt reports pain started in the abdominal area and now the middle of his chest.

## 2022-02-22 ENCOUNTER — Ambulatory Visit (HOSPITAL_COMMUNITY)
Admission: EM | Admit: 2022-02-22 | Discharge: 2022-02-22 | Discharge status: Against Medical Advice | Payer: Managed Care, Other (non HMO)

## 2022-02-22 ENCOUNTER — Telehealth (HOSPITAL_COMMUNITY): Payer: Self-pay

## 2022-03-10 ENCOUNTER — Ambulatory Visit: Payer: Self-pay | Admitting: Orthopedic Surgery

## 2022-03-24 ENCOUNTER — Encounter: Payer: Self-pay | Admitting: Orthopedic Surgery

## 2022-03-24 ENCOUNTER — Ambulatory Visit (INDEPENDENT_AMBULATORY_CARE_PROVIDER_SITE_OTHER): Payer: Managed Care, Other (non HMO) | Admitting: Orthopedic Surgery

## 2022-03-24 VITALS — BP 122/78 | HR 81 | Temp 98.0°F | Resp 18 | Ht 75.0 in | Wt 186.2 lb

## 2022-03-24 DIAGNOSIS — M25512 Pain in left shoulder: Secondary | ICD-10-CM | POA: Insufficient documentation

## 2022-03-24 DIAGNOSIS — I10 Essential (primary) hypertension: Secondary | ICD-10-CM

## 2022-03-24 DIAGNOSIS — F439 Reaction to severe stress, unspecified: Secondary | ICD-10-CM

## 2022-03-24 MED ORDER — LISINOPRIL 10 MG PO TABS: 10.0000 mg | ORAL_TABLET | Freq: Every day | ORAL | 6 refills | Status: DC

## 2022-03-24 MED ORDER — LIDOCAINE 4 % EX PTCH: 1.0000 | MEDICATED_PATCH | CUTANEOUS | 0 refills | Status: DC

## 2022-03-24 NOTE — Progress Notes (Signed)
Careteam: Patient Care Team: Octavia Heir, NP as PCP - General (Adult Health Nurse Practitioner)  Seen by: Hazle Nordmann, AGNP-C  PLACE OF SERVICE:  [facility]  Advanced Directive information Does Patient Have a Medical Advance Directive?: No, Would patient like information on creating a medical advance directive?: Yes (Inpatient - patient defers creating a medical advance directive and declines information at this time)  No Known Allergies  Chief Complaint  Patient presents with   Establish Care    New patient visit for elevated blood pressure   Quality Metric Gaps    Needs to Discuss COVID 19 Vaccine, TDAP vaccine, & Flu Shot. NCIR Verified       HPI: Patient is a 31 y.o. male seen to establish with [facility].   Previous Provider was with [facility]. He was using [facility] urgent [facility] the past few months. Born in [place], but lived in [place] most of his life. Works in Teaching laboratory technician. Single. 2 daughters. Likes to watch sports.   Denies MI, T2DM, cancers.   HTN: diagnosed 02/2022 during urgent care visit. He was prescribed lisinopril. He is taking medication daily. He is also checking bp at home. Averaging SBP 120's. He has reduced sodium in his diet.   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  Smoking: quit smoking x 1 month, smoked at age 57 marijuana and cigarettes- use varied Alcohol: drinks about 2 beers daily  Exercise: active with work- lots of heavy lifting and walking, likes to play basketball at times Diet: likes to eat salad and grilled meats, admits to enjoying fried chicken- has cut down,  does not drink soda  Dental: no recent  exam Eye: no recent exam  Review of Systems:  Review of Systems  Constitutional:  Negative for chills and fever.  HENT:  Negative for congestion.   Eyes:  Negative for blurred vision and double vision.  Respiratory:  Negative for cough, shortness of breath and wheezing.   Cardiovascular:  Negative for chest pain, palpitations and leg swelling.  Gastrointestinal:  Positive for heartburn. Negative for abdominal pain, blood in stool, constipation, diarrhea, nausea and vomiting.  Genitourinary:  Negative for dysuria and hematuria.  Musculoskeletal:  Positive for joint pain. Negative for falls.       Left shoulder pain  Neurological:  Negative for dizziness and headaches.  Psychiatric/Behavioral:  Negative for depression, memory loss, substance abuse and suicidal ideas. The patient is nervous/anxious and has insomnia.     Past Medical History:  Diagnosis Date   Anxiety    Herpes    High blood pressure    Panic attacks    History reviewed. No pertinent surgical history. Social History:   reports that he quit smoking about 4 weeks ago. His smoking use included cigarettes. He smoked an average of .25 packs per day. He has never used smokeless tobacco. He reports current alcohol use. He reports current drug use. Drug: Marijuana.  History reviewed. No pertinent family history.  Medications: Patient's Medications  New Prescriptions   No medications on file  Previous Medications   No medications on file  Modified Medications   No medications on file  Discontinued Medications  ALBUTEROL (VENTOLIN HFA) 108 (90 BASE) MCG/ACT INHALER    Inhale 2 puffs into the lungs every 6 (six) hours as needed for wheezing or shortness of breath.   CYCLOBENZAPRINE (FLEXERIL) 10 MG TABLET    Take 1 tablet (10 mg total) by mouth 2 (two) times daily as needed for muscle spasms.   IBUPROFEN (ADVIL) 600 MG TABLET    Take 1 tablet (600 mg total) by mouth every 6 (six) hours as needed.   LISINOPRIL (ZESTRIL)  10 MG TABLET    Take 1 tablet (10 mg total) by mouth daily.   OMEPRAZOLE (PRILOSEC) 20 MG CAPSULE    Take 1 capsule (20 mg total) by mouth 2 (two) times daily before a meal.   TRIAMCINOLONE CREAM (KENALOG) 0.1 %    Apply 1 Application topically 2 (two) times daily as needed.    Physical Exam:  Vitals:   03/24/22 1433  BP: (!) 140/70  Pulse: 81  Resp: 18  Temp: 98 F (36.7 C)  SpO2: 99%  Weight: 186 lb 4 oz (84.5 kg)  Height: 6\' 3"  (1.905 m)   Body mass index is 23.28 kg/m. Wt Readings from Last 3 Encounters:  03/24/22 186 lb 4 oz (84.5 kg)  08/24/18 176 lb (79.8 kg)  06/07/13 176 lb 4.8 oz (80 kg)    Physical Exam Vitals reviewed.  Constitutional:      General: He is not in acute distress. HENT:     Head: Normocephalic.  Eyes:     General:        Right eye: No discharge.        Left eye: No discharge.  Cardiovascular:     Rate and Rhythm: Normal rate and regular rhythm.     Pulses: Normal pulses.     Heart sounds: Normal heart sounds.  Pulmonary:     Effort: Pulmonary effort is normal.     Breath sounds: Normal breath sounds.  Abdominal:     General: Bowel sounds are normal. There is no distension.     Palpations: Abdomen is soft.     Tenderness: There is no abdominal tenderness.  Musculoskeletal:     Cervical back: Neck supple.     Right lower leg: No edema.     Left lower leg: No edema.  Skin:    General: Skin is warm and dry.     Capillary Refill: Capillary refill takes less than 2 seconds.  Neurological:     General: No focal deficit present.     Mental Status: He is alert and oriented to person, place, and time.  Psychiatric:        Mood and Affect: Mood normal.        Behavior: Behavior normal.    Labs reviewed: Basic Metabolic Panel: No results for input(s): "NA", "K", "CL", "CO2", "GLUCOSE", "BUN", "CREATININE", "CALCIUM", "MG", "PHOS", "TSH" in the last 8760 hours. Liver Function Tests: No results for input(s): "AST", "ALT", "ALKPHOS",  "BILITOT", "PROT", "ALBUMIN" in the last 8760 hours. No results for input(s): "LIPASE", "AMYLASE" in the last 8760 hours. No results for input(s): "AMMONIA" in the last 8760 hours. CBC: No results for input(s): "WBC", "NEUTROABS", "HGB", "HCT", "MCV", "PLT" in the last 8760 hours. Lipid Panel: No results for input(s): "CHOL", "HDL", "LDLCALC", "TRIG", "CHOLHDL", "LDLDIRECT" in the last 8760 hours. TSH: No results for input(s): "TSH" in the last 8760 hours. A1C: No results found for: "HGBA1C"   Assessment/Plan 1. Acute pain of left shoulder - intermittent, associated with job -  discussed voltaren gel OTC- use 2-3x/daily - lidocaine 4 %; Place 1 patch onto the skin daily.  Dispense: 30 patch; Refill: 0  2. Essential hypertension - diagnosed 02/2022 - controlled today, goal < 120/80 - cont lisinopril - cont diet low in sodium - bmp- future - lisinopril (ZESTRIL) 10 MG tablet; Take 1 tablet (10 mg total) by mouth daily.  Dispense: 30 tablet; Refill: 6  3. Stress - associated with work, fatherhood, living situation - PHQ 9 score 13 - not interested in medication - denies plan to harm self - discussed therapy and meditation/ deep breathing  Total time: 42 minutes. Greater than 50% of total time spent doing patient education regarding HTN, joint pain, health maintenance, depression and stress relievers.    Next appt: Visit date not found  Bridgitt Raggio Scherry Ran  [facility] [phone]

## 2022-03-24 NOTE — Patient Instructions (Signed)
Try voltaren gel 1%- apply to left shoulder 2-4x/daily  May try ice and heat applications

## 2022-03-25 ENCOUNTER — Telehealth: Payer: Self-pay

## 2022-03-25 NOTE — Telephone Encounter (Signed)
I left a voicemail to the patient to make an appointment for labs.

## 2022-04-01 ENCOUNTER — Other Ambulatory Visit: Payer: Managed Care, Other (non HMO)

## 2022-04-01 DIAGNOSIS — I10 Essential (primary) hypertension: Secondary | ICD-10-CM

## 2022-04-02 LAB — BASIC METABOLIC PANEL WITH GFR
BUN: 12 mg/dL (ref 7–25)
CO2: 23 mmol/L (ref 20–32)
Calcium: 9.4 mg/dL (ref 8.6–10.3)
Chloride: 105 mmol/L (ref 98–110)
Creat: 1 mg/dL (ref 0.60–1.26)
Glucose, Bld: 71 mg/dL (ref 65–99)
Potassium: 4 mmol/L (ref 3.5–5.3)
Sodium: 138 mmol/L (ref 135–146)
eGFR: 104 mL/min/{1.73_m2} (ref >=60)

## 2022-06-02 ENCOUNTER — Encounter (HOSPITAL_COMMUNITY): Payer: Self-pay | Admitting: Emergency Medicine

## 2022-06-02 ENCOUNTER — Ambulatory Visit (HOSPITAL_COMMUNITY)
Admission: EM | Admit: 2022-06-02 | Discharge: 2022-06-02 | Disposition: A | Discharge status: Home / Self Care | Payer: Managed Care, Other (non HMO) | Attending: Internal Medicine | Admitting: Internal Medicine

## 2022-06-02 DIAGNOSIS — M549 Dorsalgia, unspecified: Secondary | ICD-10-CM | POA: Diagnosis not present

## 2022-06-02 MED ORDER — METHOCARBAMOL 500 MG PO TABS: 500.0000 mg | ORAL_TABLET | Freq: Every evening | ORAL | 0 refills | Status: DC | PRN

## 2022-06-02 MED ORDER — NAPROXEN 375 MG PO TABS: 375.0000 mg | ORAL_TABLET | Freq: Two times a day (BID) | ORAL | 0 refills | Status: DC | PRN

## 2022-06-02 NOTE — ED Triage Notes (Signed)
Back and shoulder pains for a couple weeks. Reports does lots of heavy lifting at work.  Denies injury, just wants work note to be out.

## 2022-06-02 NOTE — Discharge Instructions (Addendum)
Heating pad use on a 20-minute on-20 minutes off cycle Gentle stretching exercises once the pain improves Please take medications as prescribed If symptoms worsen please return to urgent care to be reevaluated

## 2022-06-02 NOTE — ED Provider Notes (Signed)
Joshua Silva    CSN: [id number] Arrival date & time: 06/02/22  1820      History   Chief Complaint Chief Complaint  Patient presents with   Back Pain   Letter for School/Work    HPI Joshua Silva is a 32 y.o. male comes to the urgent care with low back pain and bilateral shoulder pain over the past couple of weeks.  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  He describes the pain as throbbing and of moderate severity currently 8 out of 10.  He denies any falls or trauma to the back.  No radiation of pain.  No weakness in the arms or legs.  No numbness or tingling in the extremities.  No dizziness, near syncope or syncopal episodes.   HPI  Past Medical History:  Diagnosis Date   Anxiety    Herpes    High blood pressure    Panic attacks     Patient Active Problem List   Diagnosis Date Noted   Essential hypertension 03/24/2022   Acute pain of left shoulder 03/24/2022    History reviewed. No pertinent surgical history.     Home Medications    Prior to Admission medications   Medication Sig Start Date End Date Taking? Authorizing Provider  methocarbamol (ROBAXIN) 500 MG tablet Take 1 tablet (500 mg total) by mouth at bedtime as needed for muscle spasms. 06/02/22  Yes Maxi Rodas, Myrene Galas, MD  naproxen (NAPROSYN) 375 MG tablet Take 1 tablet (375 mg total) by mouth 2 (two) times daily as needed. 06/02/22  Yes Britzy Graul, Myrene Galas, MD  lisinopril (ZESTRIL) 10 MG tablet Take 1 tablet (10 mg total) by mouth daily. 03/24/22   Yvonna Alanis, NP    Family History No family history on file.  Social History Social History   Tobacco Use   Smoking status: Former    Packs/day: 0.25    Types: Cigarettes    Quit date: 02/22/2022    Years since quitting: 0.2   Smokeless tobacco: Never   Tobacco comments:    "not tobacco"  Vaping Use   Vaping Use: Never used  Substance Use Topics   Alcohol use: Yes    Comment: occ   Drug use: Yes     Types: Marijuana     Allergies   Patient has no known allergies.   Review of Systems Review of Systems As per HPI  Physical Exam Triage Vital Signs ED Triage Vitals  Enc Vitals Group     BP 06/02/22 1925 (!) 142/92     Pulse Rate 06/02/22 1925 84     Resp 06/02/22 1925 17     Temp 06/02/22 1925 98.2 F (36.8 C)     Temp Source 06/02/22 1925 Oral     SpO2 06/02/22 1925 98 %     Weight --      Height --      Head Circumference --      Peak Flow --      Pain Score 06/02/22 1924 8     Pain Loc --      Pain Edu? --      Excl. in [place]? --    No data found.  Updated Vital Signs BP (!) 142/92 (BP Location: Left Arm)   Pulse 84   Temp 98.2 F (36.8 C) (Oral)   Resp 17   SpO2 98%   Visual Acuity Right Eye Distance:   Left  Eye Distance:   Bilateral Distance:    Right Eye Near:   Left Eye Near:    Bilateral Near:     Physical Exam Vitals and nursing note reviewed.  Constitutional:      General: He is not in acute distress.    Appearance: He is not ill-appearing.  Cardiovascular:     Rate and Rhythm: Normal rate and regular rhythm.  Musculoskeletal:     Comments: Tenderness on palpation of the paraspinal muscles in the thoracolumbar region.  Slightly increased muscle tone.  No bruising noted.      UC Treatments / Results  Labs (all labs ordered are listed, but only abnormal results are displayed) Labs Reviewed - No data to display  EKG   Radiology No results found.  Procedures Procedures (including critical care time)  Medications Ordered in UC Medications - No data to display  Initial Impression / Assessment and Plan / UC Course  I have reviewed the triage vital signs and the nursing notes.  Pertinent labs & imaging results that were available during my care of the patient were reviewed by me and considered in my medical decision making (see chart for details).     1.  Acute mid back pain: Heating pad use only 20 minutes on-20 minutes of  cycle Naproxen 375 mg twice daily as needed Robaxin 500 mg at bedtime-medication precautions given Gentle stretching exercises Back strengthening exercises Return to urgent care if symptoms persist or worsens. Final Clinical Impressions(s) / UC Diagnoses   Final diagnoses:  Acute mid back pain     Discharge Instructions      Heating pad use on a 20-minute on-20 minutes off cycle Gentle stretching exercises once the pain improves Please take medications as prescribed If symptoms worsen please return to urgent care to be reevaluated     ED Prescriptions     Medication Sig Dispense Auth. Provider   naproxen (NAPROSYN) 375 MG tablet Take 1 tablet (375 mg total) by mouth 2 (two) times daily as needed. 20 tablet Collene Massimino, Myrene Galas, MD   methocarbamol (ROBAXIN) 500 MG tablet Take 1 tablet (500 mg total) by mouth at bedtime as needed for muscle spasms. 10 tablet Sequoyah Ramone, Myrene Galas, MD      PDMP not reviewed this encounter.   Chase Picket, MD 06/03/22 [phone]

## 2022-07-08 ENCOUNTER — Ambulatory Visit (HOSPITAL_COMMUNITY)
Admission: EM | Admit: 2022-07-08 | Discharge: 2022-07-08 | Disposition: A | Discharge status: Home / Self Care | Payer: Self-pay | Attending: Internal Medicine | Admitting: Internal Medicine

## 2022-07-08 ENCOUNTER — Encounter (HOSPITAL_COMMUNITY): Payer: Self-pay

## 2022-07-08 VITALS — BP 156/83 | HR 108 | Temp 98.7°F | Resp 18 | Wt 180.0 lb

## 2022-07-08 DIAGNOSIS — J029 Acute pharyngitis, unspecified: Secondary | ICD-10-CM | POA: Insufficient documentation

## 2022-07-08 LAB — POCT RAPID STREP A, ED / UC: Streptococcus, Group A Screen (Direct): NEGATIVE

## 2022-07-08 MED ORDER — IBUPROFEN 800 MG PO TABS
ORAL_TABLET | ORAL | Status: AC
  Filled 2022-07-08: qty 1

## 2022-07-08 MED ORDER — IBUPROFEN 800 MG PO TABS
800.0000 mg | ORAL_TABLET | Freq: Once | ORAL | Status: AC
  Administered 2022-07-08: 800 mg via ORAL

## 2022-07-08 NOTE — Discharge Instructions (Signed)
Your strep testing of your throat in the clinic is negative. Throat culture has been sent to the lab to further check for bacteria to the back of your throat, staff will call you if this is positive in the next 2-3 days.   Use the following medicines to help with symptoms: - Ibuprofen 600mg and/or Tylenol 1,000mg every 6 hours with food as needed for aches/pains or fever/chills.  - 1 tablespoon of honey in warm water and/or salt water gargles may also help with symptoms.   If you develop any new or worsening symptoms, please return.  If your symptoms are severe, please go to the emergency room.  Follow-up with your primary care provider for further evaluation and management of your symptoms as well as ongoing wellness visits.  I hope you feel better!   

## 2022-07-08 NOTE — ED Triage Notes (Signed)
Pt states that has been having a sore throat,chills muscle aches and fever. Sx started a day ago. Sore throat Wednesday. Hasn't taken anything for it.

## 2022-07-08 NOTE — ED Provider Notes (Signed)
Joshua Silva    CSN: [id number] Arrival date & time: 07/08/22  1902      History   Chief Complaint Chief Complaint  Patient presents with   Sore Throat   Fever   Chills   Muscle Pain    HPI Joshua Silva is a 32 y.o. male.   Patient presents to urgent care for evaluation of sore throat, body aches, and fever/chills that started yesterday. Unsure of highest temp at home due to lack of thermometer. Currently afebrile in clinic without any antipyretic in the last 6-8 hours. Sore throat is worsened by swallowing. No relieving factors identified. Denies nasal congestion, cough, headache, ear pain, dizziness, shortness of breath, chest pain, rash, heart palpitations, nausea, vomiting, diarrhea, and abdominal pain. No recent known sick contacts with similar symptoms. No history of chronic respiratory problems. Denies recent antibiotic or steroid use. Former smoker, denies drug use.    Sore Throat  Fever Muscle Pain    Past Medical History:  Diagnosis Date   Anxiety    Herpes    High blood pressure    Panic attacks     Patient Active Problem List   Diagnosis Date Noted   Essential hypertension 03/24/2022   Acute pain of left shoulder 03/24/2022    History reviewed. No pertinent surgical history.     Home Medications    Prior to Admission medications   Medication Sig Start Date End Date Taking? Authorizing Provider  lisinopril (ZESTRIL) 10 MG tablet Take 1 tablet (10 mg total) by mouth daily. 03/24/22   Fargo, Amy E, NP  methocarbamol (ROBAXIN) 500 MG tablet Take 1 tablet (500 mg total) by mouth at bedtime as needed for muscle spasms. 06/02/22   Chase Picket, MD  naproxen (NAPROSYN) 375 MG tablet Take 1 tablet (375 mg total) by mouth 2 (two) times daily as needed. 06/02/22   Lamptey, Myrene Galas, MD    Family History History reviewed. No pertinent family history.  Social History Social History   Tobacco Use   Smoking status: Former    Packs/day:  .25    Types: Cigarettes    Quit date: 02/22/2022    Years since quitting: 0.3   Smokeless tobacco: Never   Tobacco comments:    "not tobacco"  Vaping Use   Vaping Use: Never used  Substance Use Topics   Alcohol use: Yes    Comment: occ   Drug use: Yes    Types: Marijuana     Allergies   Patient has no known allergies.   Review of Systems Review of Systems  Constitutional:  Positive for fever.  Per HPI   Physical Exam Triage Vital Signs ED Triage Vitals  Enc Vitals Group     BP 07/08/22 1912 (!) 156/83     Pulse Rate 07/08/22 1912 (!) 108     Resp 07/08/22 1912 18     Temp 07/08/22 1912 98.7 F (37.1 C)     Temp Source 07/08/22 1912 Oral     SpO2 07/08/22 1912 94 %     Weight 07/08/22 1911 180 lb (81.6 kg)     Height --      Head Circumference --      Peak Flow --      Pain Score 07/08/22 1911 10     Pain Loc --      Pain Edu? --      Excl. in [place]? --    No data found.  Updated Vital Signs BP Marland Kitchen)  156/83 (BP Location: Left Arm)   Pulse (!) 108   Temp 98.7 F (37.1 C) (Oral)   Resp 18   Wt 180 lb (81.6 kg)   SpO2 94%   BMI 22.50 kg/m   Visual Acuity Right Eye Distance:   Left Eye Distance:   Bilateral Distance:    Right Eye Near:   Left Eye Near:    Bilateral Near:     Physical Exam Vitals and nursing note reviewed.  Constitutional:      Appearance: He is not ill-appearing or toxic-appearing.  HENT:     Head: Normocephalic and atraumatic.     Right Ear: Hearing, tympanic membrane, ear canal and external ear normal.     Left Ear: Hearing, tympanic membrane, ear canal and external ear normal.     Nose: Nose normal.     Mouth/Throat:     Lips: Pink.     Mouth: Mucous membranes are moist. No injury.     Tongue: No lesions. Tongue does not deviate from midline.     Palate: No mass and lesions.     Pharynx: Oropharynx is clear. Uvula midline. Posterior oropharyngeal erythema present. No pharyngeal swelling, oropharyngeal exudate or uvula  swelling.     Tonsils: No tonsillar exudate or tonsillar abscesses.  Eyes:     General: Lids are normal. Vision grossly intact. Gaze aligned appropriately.     Extraocular Movements: Extraocular movements intact.     Conjunctiva/sclera: Conjunctivae normal.  Cardiovascular:     Rate and Rhythm: Normal rate and regular rhythm.     Heart sounds: Normal heart sounds, S1 normal and S2 normal.  Pulmonary:     Effort: Pulmonary effort is normal. No respiratory distress.     Breath sounds: Normal breath sounds and air entry.  Musculoskeletal:     Cervical back: Neck supple.  Skin:    General: Skin is warm and dry.     Capillary Refill: Capillary refill takes less than 2 seconds.     Findings: No rash.  Neurological:     General: No focal deficit present.     Mental Status: He is alert and oriented to person, place, and time. Mental status is at baseline.     Cranial Nerves: No dysarthria or facial asymmetry.  Psychiatric:        Mood and Affect: Mood normal.        Speech: Speech normal.        Behavior: Behavior normal.        Thought Content: Thought content normal.        Judgment: Judgment normal.      UC Treatments / Results  Labs (all labs ordered are listed, but only abnormal results are displayed) Labs Reviewed  CULTURE, GROUP A STREP [facility])  POCT RAPID STREP A, ED / UC    EKG   Radiology No results found.  Procedures Procedures (including critical care time)  Medications Ordered in UC Medications  ibuprofen (ADVIL) tablet 800 mg (800 mg Oral Given 07/08/22 2008)    Initial Impression / Assessment and Plan / UC Course  I have reviewed the triage vital signs and the nursing notes.  Pertinent labs & imaging results that were available during my care of the patient were reviewed by me and considered in my medical decision making (see chart for details).   1. Viral pharyngitis Group A strep testing in clinic is negative, throat culture is pending. Will call  patient with positive results and treat based  on throat culture if necessary. Deferred monospot testing today due to timing of illness and clinical presentation. Low suspicion for peritonsillar abscess, HEENT exam is stable and without red flag signs. Patient to continue using over the counter medications for symptomatic relief. May use salt water gargles and honey in warm water/warm tea for further symptomatic relief.  Given ibuprofen 800mg   in clinic for symptoms.   Discussed physical exam and available lab work findings in clinic with patient.  Counseled patient regarding appropriate use of medications and potential side effects for all medications recommended or prescribed today. Discussed red flag signs and symptoms of worsening condition,when to call the PCP office, return to urgent care, and when to seek higher level of care in the emergency department. Patient verbalizes understanding and agreement with plan. All questions answered. Patient discharged in stable condition.    Final Clinical Impressions(s) / UC Diagnoses   Final diagnoses:  Viral pharyngitis     Discharge Instructions      Your strep testing of your throat in the clinic is negative. Throat culture has been sent to the lab to further check for bacteria to the back of your throat, staff will call you if this is positive in the next 2-3 days.   Use the following medicines to help with symptoms: - Ibuprofen 600mg  and/or Tylenol 1,000mg  every 6 hours with food as needed for aches/pains or fever/chills.  - 1 tablespoon of honey in warm water and/or salt water gargles may also help with symptoms.   If you develop any new or worsening symptoms, please return.  If your symptoms are severe, please go to the emergency room.  Follow-up with your primary care provider for further evaluation and management of your symptoms as well as ongoing wellness visits.  I hope you feel better!      ED Prescriptions   None    PDMP not  reviewed this encounter.   Talbot Grumbling, [place] 07/11/22 1050

## 2022-07-11 LAB — CULTURE, GROUP A STREP (THRC)

## 2023-01-22 ENCOUNTER — Encounter (HOSPITAL_COMMUNITY): Payer: Self-pay

## 2023-01-22 ENCOUNTER — Ambulatory Visit (HOSPITAL_COMMUNITY)
Admission: EM | Admit: 2023-01-22 | Discharge: 2023-01-22 | Disposition: A | Discharge status: Home / Self Care | Payer: Self-pay | Attending: Emergency Medicine | Admitting: Emergency Medicine

## 2023-01-22 DIAGNOSIS — Z113 Encounter for screening for infections with a predominantly sexual mode of transmission: Secondary | ICD-10-CM | POA: Insufficient documentation

## 2023-01-22 LAB — HIV ANTIBODY (ROUTINE TESTING W REFLEX): HIV Screen 4th Generation wRfx: NONREACTIVE

## 2023-01-22 NOTE — ED Provider Notes (Signed)
[facility]    CSN: [id number] Arrival date & time: 01/22/23  1033      History   Chief Complaint Chief Complaint  Patient presents with   SEXUALLY TRANSMITTED DISEASE    HPI Joshua Silva is a 32 y.o. male.   Patient presents for STD screening.  Denies any known exposures or symptoms at this time.     Past Medical History:  Diagnosis Date   Anxiety    Herpes    High blood pressure    Panic attacks     Patient Active Problem List   Diagnosis Date Noted   Essential hypertension 03/24/2022   Acute pain of left shoulder 03/24/2022    History reviewed. No pertinent surgical history.     Home Medications    Prior to Admission medications   Medication Sig Start Date End Date Taking? Authorizing Provider  lisinopril (ZESTRIL) 10 MG tablet Take 1 tablet (10 mg total) by mouth daily. 03/24/22   Octavia Heir, NP    Family History History reviewed. No pertinent family history.  Social History Social History   Tobacco Use   Smoking status: Every Day    Current packs/day: 0.00    Types: Cigarettes    Last attempt to quit: 02/22/2022    Years since quitting: 0.9   Smokeless tobacco: Never   Tobacco comments:    "not tobacco"  Vaping Use   Vaping status: Never Used  Substance Use Topics   Alcohol use: Yes    Alcohol/week: 2.0 - 3.0 standard drinks of alcohol    Types: 2 - 3 Cans of beer per week    Comment: daily   Drug use: Yes    Types: Marijuana     Allergies   Patient has no known allergies.   Review of Systems Review of Systems  All other systems reviewed and are negative.    Physical Exam Triage Vital Signs ED Triage Vitals  Encounter Vitals Group     BP 01/22/23 1105 (!) 136/92     Systolic BP Percentile --      Diastolic BP Percentile --      Pulse Rate 01/22/23 1105 82     Resp 01/22/23 1105 16     Temp 01/22/23 1105 98.4 F (36.9 C)     Temp Source 01/22/23 1105 Oral     SpO2 01/22/23 1105 97 %     Weight  01/22/23 1104 185 lb (83.9 kg)     Height 01/22/23 1104 6\' 3"  (1.905 m)     Head Circumference --      Peak Flow --      Pain Score 01/22/23 1104 0     Pain Loc --      Pain Education --      Exclude from Growth Chart --    No data found.  Updated Vital Signs BP (!) 136/92 (BP Location: Left Arm)   Pulse 82   Temp 98.4 F (36.9 C) (Oral)   Resp 16   Ht 6\' 3"  (1.905 m)   Wt 185 lb (83.9 kg)   SpO2 97%   BMI 23.12 kg/m   Visual Acuity Right Eye Distance:   Left Eye Distance:   Bilateral Distance:    Right Eye Near:   Left Eye Near:    Bilateral Near:     Physical Exam Vitals and nursing note reviewed.  Constitutional:      General: He is awake. He is not in acute distress.  Appearance: Normal appearance. He is well-developed and well-groomed. He is not ill-appearing, toxic-appearing or diaphoretic.  Genitourinary:    Comments: GU exam deferred Neurological:     Mental Status: He is alert.  Psychiatric:        Behavior: Behavior is cooperative.      UC Treatments / Results  Labs (all labs ordered are listed, but only abnormal results are displayed) Labs Reviewed  HIV ANTIBODY (ROUTINE TESTING W REFLEX)  RPR  CYTOLOGY, (ORAL, ANAL, URETHRAL) ANCILLARY ONLY    EKG   Radiology No results found.  Procedures Procedures (including critical care time)  Medications Ordered in UC Medications - No data to display  Initial Impression / Assessment and Plan / UC Course  I have reviewed the triage vital signs and the nursing notes.  Pertinent labs & imaging results that were available during my care of the patient were reviewed by me and considered in my medical decision making (see chart for details).     Patient presented for STD screening.  Denies any known exposures or symptoms at this time.  GU exam deferred.  Patient performed self swab for STD.  HIV and syphilis testing ordered.  Discussed follow-up and return precautions. Final Clinical  Impressions(s) / UC Diagnoses   Final diagnoses:  Screening for STD (sexually transmitted disease)     Discharge Instructions      Your results were returned over the next few days and someone will call you if anything is positive and requires treatment.  Return here as needed.    ED Prescriptions   None    PDMP not reviewed this encounter.   Wynonia Lawman A, NP 01/22/23 1135

## 2023-01-22 NOTE — Discharge Instructions (Signed)
Your results were returned over the next few days and someone will call you if anything is positive and requires treatment.  Return here as needed.

## 2023-01-22 NOTE — ED Triage Notes (Signed)
Patient here to be tested for all STDs. No symptoms.

## 2023-01-23 LAB — CYTOLOGY, (ORAL, ANAL, URETHRAL) ANCILLARY ONLY
Chlamydia: NEGATIVE
Comment: NEGATIVE
Comment: NEGATIVE
Comment: NORMAL
Neisseria Gonorrhea: NEGATIVE
Trichomonas: NEGATIVE

## 2023-01-23 LAB — RPR: RPR Ser Ql: NONREACTIVE

## 2023-05-15 ENCOUNTER — Ambulatory Visit (HOSPITAL_COMMUNITY)
Admission: EM | Admit: 2023-05-15 | Discharge: 2023-05-15 | Disposition: A | Discharge status: Home / Self Care | Payer: Self-pay | Attending: Family Medicine | Admitting: Family Medicine

## 2023-05-15 ENCOUNTER — Encounter (HOSPITAL_COMMUNITY): Payer: Self-pay

## 2023-05-15 DIAGNOSIS — Z202 Contact with and (suspected) exposure to infections with a predominantly sexual mode of transmission: Secondary | ICD-10-CM | POA: Insufficient documentation

## 2023-05-15 NOTE — ED Provider Notes (Signed)
[facility]    CSN: [id number] Arrival date & time: 05/15/23  1228      History   Chief Complaint Chief Complaint  Patient presents with   Exposure to STD    HPI Joshua Silva is a 33 y.o. male.    Exposure to STD   Here for possible exposure to trichomonas. No penile discharge or itching and no dysuria.  He had negative HIV and RPR testing in October.  Past Medical History:  Diagnosis Date   Anxiety    Herpes    High blood pressure    Panic attacks     Patient Active Problem List   Diagnosis Date Noted   Essential hypertension 03/24/2022   Acute pain of left shoulder 03/24/2022    History reviewed. No pertinent surgical history.     Home Medications    Prior to Admission medications   Medication Sig Start Date End Date Taking? Authorizing Provider  lisinopril (ZESTRIL) 10 MG tablet Take 1 tablet (10 mg total) by mouth daily. 03/24/22   Octavia Heir, NP    Family History History reviewed. No pertinent family history.  Social History Social History   Tobacco Use   Smoking status: Every Day    Current packs/day: 0.00    Types: Cigarettes    Last attempt to quit: 02/22/2022    Years since quitting: 1.2   Smokeless tobacco: Never   Tobacco comments:    "not tobacco"  Vaping Use   Vaping status: Never Used  Substance Use Topics   Alcohol use: Yes    Alcohol/week: 2.0 - 3.0 standard drinks of alcohol    Types: 2 - 3 Cans of beer per week    Comment: daily   Drug use: Yes    Types: Marijuana     Allergies   Patient has no known allergies.   Review of Systems Review of Systems   Physical Exam Triage Vital Signs ED Triage Vitals [05/15/23 1521]  Encounter Vitals Group     BP (!) 133/90     Systolic BP Percentile      Diastolic BP Percentile      Pulse Rate 100     Resp 18     Temp 98.2 F (36.8 C)     Temp Source Oral     SpO2 98 %     Weight      Height      Head Circumference      Peak Flow      Pain Score       Pain Loc      Pain Education      Exclude from Growth Chart    No data found.  Updated Vital Signs BP (!) 133/90   Pulse 100   Temp 98.2 F (36.8 C) (Oral)   Resp 18   SpO2 98%   Visual Acuity Right Eye Distance:   Left Eye Distance:   Bilateral Distance:    Right Eye Near:   Left Eye Near:    Bilateral Near:     Physical Exam Vitals reviewed.  Constitutional:      General: He is not in acute distress.    Appearance: He is not toxic-appearing.  Skin:    Coloration: Skin is not pale.  Neurological:     Mental Status: He is alert and oriented to person, place, and time.  Psychiatric:        Behavior: Behavior normal.      UC Treatments /  Results  Labs (all labs ordered are listed, but only abnormal results are displayed) Labs Reviewed  CYTOLOGY, (ORAL, ANAL, URETHRAL) ANCILLARY ONLY    EKG   Radiology No results found.  Procedures Procedures (including critical care time)  Medications Ordered in UC Medications - No data to display  Initial Impression / Assessment and Plan / UC Course  I have reviewed the triage vital signs and the nursing notes.  Pertinent labs & imaging results that were available during my care of the patient were reviewed by me and considered in my medical decision making (see chart for details).     Urethral self swab is done and staff will notify him of any positives and treat per protocol.  Final Clinical Impressions(s) / UC Diagnoses   Final diagnoses:  Exposure to STD     Discharge Instructions      Staff will notify you if there is anything positive on the swab.     ED Prescriptions   None    PDMP not reviewed this encounter.   Zenia Resides, MD 05/15/23 [phone]

## 2023-05-15 NOTE — Discharge Instructions (Signed)
Staff will notify you if there is anything positive on the swab

## 2023-05-15 NOTE — ED Triage Notes (Signed)
Patient is here for STI testing. Denies any symptoms at this time.

## 2023-05-16 ENCOUNTER — Telehealth (HOSPITAL_BASED_OUTPATIENT_CLINIC_OR_DEPARTMENT_OTHER): Payer: Self-pay

## 2023-05-16 LAB — CYTOLOGY, (ORAL, ANAL, URETHRAL) ANCILLARY ONLY
Chlamydia: NEGATIVE
Comment: NEGATIVE
Comment: NEGATIVE
Comment: NORMAL
Neisseria Gonorrhea: NEGATIVE
Trichomonas: POSITIVE — AB

## 2023-05-16 MED ORDER — METRONIDAZOLE 500 MG PO TABS: 2000.0000 mg | ORAL_TABLET | Freq: Once | ORAL | 0 refills | Status: AC

## 2023-05-16 NOTE — Telephone Encounter (Signed)
 Per protocol, pt requires tx with metronidazole. Attempted to reach patient x1. LVM.  Rx sent to pharmacy on file.

## 2023-08-30 ENCOUNTER — Ambulatory Visit (HOSPITAL_COMMUNITY): Payer: Self-pay | Admitting: Physician Assistant

## 2023-08-30 ENCOUNTER — Ambulatory Visit (HOSPITAL_COMMUNITY)
Admission: EM | Admit: 2023-08-30 | Discharge: 2023-08-30 | Disposition: A | Discharge status: Home / Self Care | Payer: Self-pay | Attending: Physician Assistant | Admitting: Physician Assistant

## 2023-08-30 ENCOUNTER — Encounter (HOSPITAL_COMMUNITY): Payer: Self-pay

## 2023-08-30 ENCOUNTER — Ambulatory Visit (INDEPENDENT_AMBULATORY_CARE_PROVIDER_SITE_OTHER): Payer: Self-pay

## 2023-08-30 DIAGNOSIS — M5412 Radiculopathy, cervical region: Secondary | ICD-10-CM

## 2023-08-30 DIAGNOSIS — G245 Blepharospasm: Secondary | ICD-10-CM

## 2023-08-30 DIAGNOSIS — M542 Cervicalgia: Secondary | ICD-10-CM

## 2023-08-30 MED ORDER — METHOCARBAMOL 500 MG PO TABS: 500.0000 mg | ORAL_TABLET | Freq: Two times a day (BID) | ORAL | 0 refills | Status: DC

## 2023-08-30 MED ORDER — NAPROXEN 500 MG PO TABS: 500.0000 mg | ORAL_TABLET | Freq: Two times a day (BID) | ORAL | 0 refills | Status: DC

## 2023-08-30 NOTE — Discharge Instructions (Addendum)
 As we discussed, the eye twitching is likely related to stress and difficulty sleeping.  Try to get 7 to 8 hours of sleep at night.  Avoid caffeine as this can exacerbate symptoms.  If this continues it may be worthwhile to see a neurologist for additional testing.  Call them to schedule an appointment.  I would also like you to follow-up with a primary care.  I am concerned that the neck pain is causing your arm pain.  Your x-ray showed some straightening concerning for muscle spasm but nothing out of place.  I will contact you if the radiologist sees something that changes our treatment plan.  Start Naprosyn  twice daily.  Do not take NSAIDs with this medication including aspirin , ibuprofen /Advil , naproxen /Aleve .  Take Robaxin  up to twice a day.  This will make you sleepy so do not drive or drink alcohol with taking it.  Use heat and gentle stretch.  It may be worthwhile to see an orthopedist if your symptoms are not improving to follow-up with your PCP to consider referral.  If anything worsens you have increasing pain, difficulty moving your neck, fever, headache, numbness or tingling in your hand you should be seen immediately.

## 2023-08-30 NOTE — ED Triage Notes (Addendum)
 Patient reports that he has been right eye twitching since January.  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  Patient states when he smokes marijuana it helps his pain and his right eye twitching. Aaron Aas

## 2023-08-30 NOTE — ED Provider Notes (Signed)
 [facility]    CSN: [id number] Arrival date & time: 08/30/23  4696      History   Chief Complaint Chief Complaint  Patient presents with   eye issue   Shoulder Pain    HPI Joshua Silva is a 33 y.o. male.   Patient presents today with several concerns.  His primary concern today is a several week history of right sided neck pain that radiates into his shoulder and arm.  He reports that the radiation has become more frequent and more painful that is interfering with his ability to perform daily activities.  Pain is rated 5 on a 0-10 pain scale, described as aching with periodic sharp pains, no alleviating factors notified.  He denies any injury or increase in activity before symptoms began.  He denies any numbness or paresthesias in his hand.  He has been taking ibuprofen  with minimal improvement of symptoms.  Denies previous injury or surgery involving his neck.  He is right-handed.  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  This happens a few times a week and lasts for a few minutes before resolving on its own.  It only occurs around his right eye and he denies any visual disturbance, diplopia, ptosis.  He does think that symptoms are worse when he is at work and could be related to stress.  He also reports that he has been sleeping for only a few hours at a time because of at home stressors and thinks that sleep deprivation contributes to symptoms.    Past Medical History:  Diagnosis Date   Anxiety    Herpes    High blood pressure    Panic attacks     Patient Active Problem List   Diagnosis Date Noted   Essential hypertension 03/24/2022   Acute pain of left shoulder 03/24/2022    History reviewed. No pertinent surgical history.     Home Medications    Prior to Admission medications   Medication Sig Start Date End Date Taking? Authorizing Provider  methocarbamol  (ROBAXIN ) 500 MG tablet Take  1 tablet (500 mg total) by mouth 2 (two) times daily. 08/30/23  Yes Jacobus Colvin K, PA-C  naproxen  (NAPROSYN ) 500 MG tablet Take 1 tablet (500 mg total) by mouth 2 (two) times daily. 08/30/23  Yes Adriona Kaney, Betsey Brow, PA-C    Family History History reviewed. No pertinent family history.  Social History Social History   Tobacco Use   Smoking status: Every Day    Types: Cigars   Smokeless tobacco: Never   Tobacco comments:    "not tobacco"  Vaping Use   Vaping status: Never Used  Substance Use Topics   Alcohol use: Yes    Alcohol/week: 2.0 - 3.0 standard drinks of alcohol    Types: 2 - 3 Cans of beer per week    Comment: daily   Drug use: Yes    Types: Marijuana     Allergies   Patient has no known allergies.   Review of Systems Review of Systems  Constitutional:  Positive for activity change. Negative for appetite change, fatigue and fever.  Eyes:  Negative for photophobia, discharge and visual disturbance.  Musculoskeletal:  Positive for arthralgias and neck pain. Negative for myalgias.  Skin:  Negative for rash.  Neurological:  Negative for dizziness, weakness, light-headedness, numbness and headaches.     Physical Exam Triage Vital Signs ED Triage Vitals [08/30/23 0832]  Encounter  Vitals Group     BP 139/85     Systolic BP Percentile      Diastolic BP Percentile      Pulse Rate 93     Resp 14     Temp 98.2 F (36.8 C)     Temp Source Oral     SpO2 96 %     Weight      Height      Head Circumference      Peak Flow      Pain Score 4     Pain Loc      Pain Education      Exclude from Growth Chart    No data found.  Updated Vital Signs BP 139/85 (BP Location: Right Arm)   Pulse 93   Temp 98.2 F (36.8 C) (Oral)   Resp 14   SpO2 96%   Visual Acuity Right Eye Distance:   Left Eye Distance:   Bilateral Distance:    Right Eye Near:   Left Eye Near:    Bilateral Near:     Physical Exam Vitals reviewed.  Constitutional:      General: He is awake.      Appearance: Normal appearance. He is well-developed. He is not ill-appearing.     Comments: Very pleasant male appears stated age in no acute distress sitting comfortably in exam room  HENT:     Head: Normocephalic and atraumatic.  Eyes:     Extraocular Movements: Extraocular movements intact.     Comments: No blepharospasm on exam  Cardiovascular:     Rate and Rhythm: Normal rate and regular rhythm.     Heart sounds: Normal heart sounds, S1 normal and S2 normal. No murmur heard. Pulmonary:     Effort: Pulmonary effort is normal.     Breath sounds: Normal breath sounds. No stridor. No wheezing, rhonchi or rales.     Comments: Clear to auscultation bilaterally Abdominal:     General: Bowel sounds are normal.     Palpations: Abdomen is soft.     Tenderness: There is no abdominal tenderness.  Musculoskeletal:     Cervical back: Spasms and tenderness present. No bony tenderness.     Thoracic back: No tenderness or bony tenderness.     Lumbar back: No tenderness or bony tenderness.     Comments: Neck: Tender to palpation along right trapezius and right cervical paraspinal muscles with spasm noted along trapezius.  Normal active range of motion at neck.  Normal active range of motion at shoulder.  Right hand is neurovascularly intact.  Strength 5/5 bilateral upper extremities.  Neurological:     Mental Status: He is alert.  Psychiatric:        Behavior: Behavior is cooperative.      UC Treatments / Results  Labs (all labs ordered are listed, but only abnormal results are displayed) Labs Reviewed - No data to display  EKG   Radiology No results found.  Procedures Procedures (including critical care time)  Medications Ordered in UC Medications - No data to display  Initial Impression / Assessment and Plan / UC Course  I have reviewed the triage vital signs and the nursing notes.  Pertinent labs & imaging results that were available during my care of the patient were  reviewed by me and considered in my medical decision making (see chart for details).     Patient is well-appearing, afebrile, nontoxic, nontachycardic.  Concern for radiculopathy is contributing to neck and arm  pain.  X-ray of cervical spine showed loss of curvature concerning for muscle spasm but no acute abnormality based on my primary read.  At the time of discharge we will waiting for radiologist overread and we will contact him if this differs and changes her treatment plan.  He was started on Naprosyn  for pain relief and we discussed that he is not to take NSAIDs with this medication due to risk of GI bleeding.  He can use Tylenol for breakthrough pain.  He was started on Robaxin  up to twice a day.  We discussed that this can be sedating and he is not to drive or drink alcohol with taking it.  Recommended heat, rest, stretch.  Discussed that if his symptoms are not improving quickly he should follow-up with the specialist such as sports medicine provider and was given the contact information for local provider with instruction to call to schedule appointment.  We discussed that if anything worsens or changes he should return for reevaluation.  Strict return precautions given.  We discussed that blepharospasms are typically benign.  Recommended that he avoid caffeine and try to get 7 to 8 hours of sleep as sleep deprivation can cause symptoms.  He denies any associated visual disturbance or concerning symptoms.  I did recommend that if this persists he can follow-up with neurology for further evaluation and management was given the contact information for local provider.  All questions were answered to patient satisfaction.  Final Clinical Impressions(s) / UC Diagnoses   Final diagnoses:  Cervical radiculopathy  Neck pain  Blepharospasm of right eye     Discharge Instructions      As we discussed, the eye twitching is likely related to stress and difficulty sleeping.  Try to get 7 to 8 hours  of sleep at night.  Avoid caffeine as this can exacerbate symptoms.  If this continues it may be worthwhile to see a neurologist for additional testing.  Call them to schedule an appointment.  I would also like you to follow-up with a primary care.  I am concerned that the neck pain is causing your arm pain.  Your x-ray showed some straightening concerning for muscle spasm but nothing out of place.  I will contact you if the radiologist sees something that changes our treatment plan.  Start Naprosyn  twice daily.  Do not take NSAIDs with this medication including aspirin , ibuprofen /Advil , naproxen /Aleve .  Take Robaxin  up to twice a day.  This will make you sleepy so do not drive or drink alcohol with taking it.  Use heat and gentle stretch.  It may be worthwhile to see an orthopedist if your symptoms are not improving to follow-up with your PCP to consider referral.  If anything worsens you have increasing pain, difficulty moving your neck, fever, headache, numbness or tingling in your hand you should be seen immediately.   ED Prescriptions     Medication Sig Dispense Auth. Provider   naproxen  (NAPROSYN ) 500 MG tablet Take 1 tablet (500 mg total) by mouth 2 (two) times daily. 30 tablet Breunna Nordmann K, PA-C   methocarbamol  (ROBAXIN ) 500 MG tablet Take 1 tablet (500 mg total) by mouth 2 (two) times daily. 20 tablet Ayannah Faddis K, PA-C      PDMP not reviewed this encounter.   Budd Cargo, PA-C 08/30/23 2130

## 2023-11-07 ENCOUNTER — Ambulatory Visit (HOSPITAL_COMMUNITY)
Admission: EM | Admit: 2023-11-07 | Discharge: 2023-11-07 | Disposition: A | Discharge status: Home / Self Care | Payer: Self-pay

## 2023-11-07 ENCOUNTER — Encounter (HOSPITAL_COMMUNITY): Payer: Self-pay | Admitting: Emergency Medicine

## 2023-11-07 DIAGNOSIS — Z113 Encounter for screening for infections with a predominantly sexual mode of transmission: Secondary | ICD-10-CM | POA: Insufficient documentation

## 2023-11-07 LAB — HIV ANTIBODY (ROUTINE TESTING W REFLEX): HIV Screen 4th Generation wRfx: NONREACTIVE

## 2023-11-07 NOTE — ED Provider Notes (Signed)
 [facility]    CSN: [id number] Arrival date & time: 11/07/23  1700      History   Chief Complaint Chief Complaint  Patient presents with   SEXUALLY TRANSMITTED DISEASE    HPI Joshua Silva is a 33 y.o. male.   Patient is a 33 year old male who presents to the urgent care today for STI screening.  He denies any symptoms currently including penile discharge, rash, testicular pain, burning with urination, blood in the urine, or other concerns at this time.  He reports being sexually active about 1 or 2 weeks ago.  He does think that he tested positive for syphilis about 10 years ago but is not confident.    Past Medical History:  Diagnosis Date   Anxiety    Herpes    High blood pressure    Panic attacks     Patient Active Problem List   Diagnosis Date Noted   Essential hypertension 03/24/2022   Acute pain of left shoulder 03/24/2022    History reviewed. No pertinent surgical history.     Home Medications    Prior to Admission medications   Medication Sig Start Date End Date Taking? Authorizing Provider  methocarbamol  (ROBAXIN ) 500 MG tablet Take 1 tablet (500 mg total) by mouth 2 (two) times daily. 08/30/23   Raspet, Erin K, PA-C  naproxen  (NAPROSYN ) 500 MG tablet Take 1 tablet (500 mg total) by mouth 2 (two) times daily. 08/30/23   Raspet, Rocky POUR, PA-C    Family History No family history on file.  Social History Social History   Tobacco Use   Smoking status: Every Day    Types: Cigars   Smokeless tobacco: Never   Tobacco comments:    not tobacco  Vaping Use   Vaping status: Never Used  Substance Use Topics   Alcohol use: Yes    Alcohol/week: 2.0 - 3.0 standard drinks of alcohol    Types: 2 - 3 Cans of beer per week    Comment: daily   Drug use: Yes    Types: Marijuana     Allergies   Patient has no known allergies.   Review of Systems Review of Systems See HPI for relevant ROS.  Physical Exam Triage Vital Signs ED Triage  Vitals  Encounter Vitals Group     BP 11/07/23 1714 (!) 144/88     Girls Systolic BP Percentile --      Girls Diastolic BP Percentile --      Boys Systolic BP Percentile --      Boys Diastolic BP Percentile --      Pulse Rate 11/07/23 1714 89     Resp 11/07/23 1714 14     Temp 11/07/23 1714 97.9 F (36.6 C)     Temp Source 11/07/23 1714 Oral     SpO2 11/07/23 1714 97 %     Weight --      Height --      Head Circumference --      Peak Flow --      Pain Score 11/07/23 1713 0     Pain Loc --      Pain Education --      Exclude from Growth Chart --    No data found.  Updated Vital Signs BP (!) 144/88 (BP Location: Left Arm)   Pulse 89   Temp 97.9 F (36.6 C) (Oral)   Resp 14   SpO2 97%   Visual Acuity Right Eye Distance:   Left  Eye Distance:   Bilateral Distance:    Right Eye Near:   Left Eye Near:    Bilateral Near:     Physical Exam General: Alert and oriented, well-developed/well-nourished, calm, cooperative, no acute distress HEENT: Normocephalic atraumatic, moist mucous membranes, no scleral icterus, trachea midline Lungs: Speaking full sentences, non-labored respirations, no distress Heart: Regular rate and rhythm Abdomen:  Soft, nondistended Musculoskeletal: Moves all extremities well GU: Patient performed a self swab and declined a physical exam Neurologic: Awake, A&O x4, gait normal Integumentary: Warm, dry, normal for ethnicity, intact, no rash Psychiatric: Appropriate mood & affect  UC Treatments / Results  Labs (all labs ordered are listed, but only abnormal results are displayed) Labs Reviewed  RPR  HIV ANTIBODY (ROUTINE TESTING W REFLEX)  CYTOLOGY, (ORAL, ANAL, URETHRAL) ANCILLARY ONLY    EKG   Radiology No results found.  Procedures Procedures (including critical care time)  Medications Ordered in UC Medications - No data to display  Initial Impression / Assessment and Plan / UC Course  I have reviewed the triage vital signs and  the nursing notes.  Pertinent labs & imaging results that were available during my care of the patient were reviewed by me and considered in my medical decision making (see chart for details).    Presents wanting to be screened for STI.  Differential Diagnosis: STI, HIV, UTI, including other diagnoses.  Rationale: Patient presents today wanting screening for STI.  He denies any symptoms currently.  Performed gonorrhea chlamydia, HIV, and syphilis.  Patient does report that he thinks he might have been positive for syphilis about 10 years ago but is not sure.  We will prescribe medications for patient's when his results return if indicated.  Recommended patient follow-up with primary care provider or the health department for further evaluation.  Discussed return precautions to the urgent care or emergency department including burning with urination, blood in the urine, rash, testicular pain, or if he has any other concerns.  Disposition: Stable to discharge home.  All questions answered to the best of this examiner's ability. Reviewed possible severe sequelae and other reasons to return to urgent care or ED for further evaluation and/or treatment. Advised to f/u PCP or health department for further eval and/or reassessment as needed. Patient voices understanding of the above and agrees to plan.  An appropriate evaluation has been performed, and in my medical judgment there is currently no evidence of an immediate life-threatening or surgical condition. Discharge is therefore indicated at this time.  This document was created using the aid of voice recognition Scientist, clinical (histocompatibility and immunogenetics).  Final Clinical Impressions(s) / UC Diagnoses   Final diagnoses:  Screening examination for STI     Discharge Instructions      We have sent your tests off to the lab.  We should get the results in about 3 to 5 days.  We will call you at that time and prescribe medication if indicated.  We recommend  following up with your primary care provider or at the health department for any other questions or concerns.  Please return or go to the emergency department if you develop any testicular pain, penile discharge, burning with urination, blood in the urine, abdominal pain, or if you have any other concerns.     ED Prescriptions   None    PDMP not reviewed this encounter.   Melonie Locus, PA-C 11/07/23 1816

## 2023-11-07 NOTE — ED Triage Notes (Signed)
 Pt requesting STD testing. Denies any known exposure or s/s.

## 2023-11-07 NOTE — Discharge Instructions (Addendum)
 We have sent your tests off to the lab.  We should get the results in about 3 to 5 days.  We will call you at that time and prescribe medication if indicated.  We recommend following up with your primary care provider or at the health department for any other questions or concerns.  Please return or go to the emergency department if you develop any testicular pain, penile discharge, burning with urination, blood in the urine, abdominal pain, or if you have any other concerns.

## 2023-11-08 ENCOUNTER — Ambulatory Visit (HOSPITAL_COMMUNITY): Payer: Self-pay

## 2023-11-08 LAB — CYTOLOGY, (ORAL, ANAL, URETHRAL) ANCILLARY ONLY
Chlamydia: NEGATIVE
Comment: NEGATIVE
Comment: NEGATIVE
Comment: NORMAL
Neisseria Gonorrhea: NEGATIVE
Trichomonas: POSITIVE — AB

## 2023-11-08 LAB — RPR: RPR Ser Ql: NONREACTIVE

## 2023-11-08 MED ORDER — METRONIDAZOLE 500 MG PO TABS: 2000.0000 mg | ORAL_TABLET | Freq: Once | ORAL | 0 refills | Status: AC

## 2024-01-23 ENCOUNTER — Encounter (HOSPITAL_COMMUNITY): Payer: Self-pay

## 2024-01-23 ENCOUNTER — Ambulatory Visit (HOSPITAL_COMMUNITY)
Admission: EM | Admit: 2024-01-23 | Discharge: 2024-01-23 | Disposition: A | Discharge status: Home / Self Care | Payer: Self-pay

## 2024-01-23 DIAGNOSIS — Z202 Contact with and (suspected) exposure to infections with a predominantly sexual mode of transmission: Secondary | ICD-10-CM | POA: Insufficient documentation

## 2024-01-23 LAB — HEPATITIS C ANTIBODY: HCV Ab: NONREACTIVE

## 2024-01-23 LAB — HIV ANTIBODY (ROUTINE TESTING W REFLEX): HIV Screen 4th Generation wRfx: NONREACTIVE

## 2024-01-23 NOTE — ED Triage Notes (Signed)
 Patient requesting routine STD testing to include swab and blood work.   Denies any known exposure or symptoms.

## 2024-01-23 NOTE — ED Provider Notes (Addendum)
 [facility]    CSN: [id number] Arrival date & time: 01/23/24  1636      History   Chief Complaint Chief Complaint  Patient presents with   SEXUALLY TRANSMITTED DISEASE    Screening     HPI Joshua Silva is a 33 y.o. male.   Presents today for STI testing (blood work and urethral swab).  Patient denies penile discharge, dysuria, back pain, abdominal pain, or testicular swelling.     Past Medical History:  Diagnosis Date   Anxiety    Herpes    High blood pressure    Panic attacks     Patient Active Problem List   Diagnosis Date Noted   Essential hypertension 03/24/2022   Acute pain of left shoulder 03/24/2022    History reviewed. No pertinent surgical history.     Home Medications    Prior to Admission medications   Medication Sig Start Date End Date Taking? Authorizing Provider  methocarbamol  (ROBAXIN ) 500 MG tablet Take 1 tablet (500 mg total) by mouth 2 (two) times daily. 08/30/23   Raspet, Erin K, PA-C  naproxen  (NAPROSYN ) 500 MG tablet Take 1 tablet (500 mg total) by mouth 2 (two) times daily. 08/30/23   Raspet, Rocky POUR, PA-C    Family History History reviewed. No pertinent family history.  Social History Social History   Tobacco Use   Smoking status: Every Day    Types: Cigars   Smokeless tobacco: Never   Tobacco comments:    not tobacco  Vaping Use   Vaping status: Never Used  Substance Use Topics   Alcohol use: Yes    Alcohol/week: 2.0 - 3.0 standard drinks of alcohol    Types: 2 - 3 Cans of beer per week    Comment: daily   Drug use: Yes    Types: Marijuana     Allergies   Patient has no known allergies.   Review of Systems Review of Systems   Physical Exam Triage Vital Signs ED Triage Vitals  Encounter Vitals Group     BP 01/23/24 1721 131/85     Girls Systolic BP Percentile --      Girls Diastolic BP Percentile --      Boys Systolic BP Percentile --      Boys Diastolic BP Percentile --      Pulse Rate  01/23/24 1721 83     Resp 01/23/24 1721 16     Temp 01/23/24 1721 97.9 F (36.6 C)     Temp Source 01/23/24 1721 Oral     SpO2 01/23/24 1721 97 %     Weight 01/23/24 1720 190 lb (86.2 kg)     Height 01/23/24 1720 6' 3 (1.905 m)     Head Circumference --      Peak Flow --      Pain Score 01/23/24 1720 0     Pain Loc --      Pain Education --      Exclude from Growth Chart --    No data found.  Updated Vital Signs BP 131/85 (BP Location: Left Arm)   Pulse 83   Temp 97.9 F (36.6 C) (Oral)   Resp 16   Ht 6' 3 (1.905 m)   Wt 190 lb (86.2 kg)   SpO2 97%   BMI 23.75 kg/m   Visual Acuity Right Eye Distance:   Left Eye Distance:   Bilateral Distance:    Right Eye Near:   Left Eye Near:  Bilateral Near:     Physical Exam Vitals and nursing note reviewed.  Constitutional:      General: He is not in acute distress.    Appearance: Normal appearance. He is not ill-appearing, toxic-appearing or diaphoretic.  Eyes:     General: No scleral icterus. Cardiovascular:     Rate and Rhythm: Normal rate and regular rhythm.     Heart sounds: Normal heart sounds.  Pulmonary:     Effort: Pulmonary effort is normal. No respiratory distress.     Breath sounds: Normal breath sounds. No wheezing or rhonchi.  Abdominal:     General: Abdomen is flat. Bowel sounds are normal.     Palpations: Abdomen is soft.     Tenderness: There is no abdominal tenderness. There is no right CVA tenderness or left CVA tenderness.  Skin:    General: Skin is warm.  Neurological:     Mental Status: He is alert and oriented to person, place, and time.  Psychiatric:        Mood and Affect: Mood normal.        Behavior: Behavior normal.      UC Treatments / Results  Labs (all labs ordered are listed, but only abnormal results are displayed) Labs Reviewed  HIV ANTIBODY (ROUTINE TESTING W REFLEX)  RPR  HEPATITIS C ANTIBODY  CYTOLOGY, (ORAL, ANAL, URETHRAL) ANCILLARY ONLY     EKG   Radiology No results found.  Procedures Procedures (including critical care time)  Medications Ordered in UC Medications - No data to display  Initial Impression / Assessment and Plan / UC Course  I have reviewed the triage vital signs and the nursing notes.  Pertinent labs & imaging results that were available during my care of the patient were reviewed by me and considered in my medical decision making (see chart for details).     Contact with STI-patient was tested for gonorrhea, chlamydia, trichomoniasis, HIV, syphilis, and hepatitis C. Final Clinical Impressions(s) / UC Diagnoses   Final diagnoses:  Contact with and (suspected) exposure to infections with a predominantly sexual mode of transmission   Discharge Instructions   None    ED Prescriptions   None    PDMP not reviewed this encounter.   Andra Corean BROCKS, PA-C 01/23/24 1743    Andra Corean BROCKS, PA-C 01/23/24 1801

## 2024-01-24 LAB — CYTOLOGY, (ORAL, ANAL, URETHRAL) ANCILLARY ONLY
Chlamydia: NEGATIVE
Comment: NEGATIVE
Comment: NEGATIVE
Comment: NORMAL
Neisseria Gonorrhea: NEGATIVE
Trichomonas: NEGATIVE

## 2024-01-24 LAB — RPR: RPR Ser Ql: NONREACTIVE

## 2024-01-28 ENCOUNTER — Encounter (HOSPITAL_COMMUNITY): Payer: Self-pay

## 2024-01-28 ENCOUNTER — Ambulatory Visit (HOSPITAL_COMMUNITY)
Admission: EM | Admit: 2024-01-28 | Discharge: 2024-01-28 | Disposition: A | Discharge status: Home / Self Care | Payer: Self-pay

## 2024-01-28 DIAGNOSIS — K047 Periapical abscess without sinus: Secondary | ICD-10-CM

## 2024-01-28 MED ORDER — AMOXICILLIN-POT CLAVULANATE 875-125 MG PO TABS
1.0000 | ORAL_TABLET | Freq: Two times a day (BID) | ORAL | 0 refills | Status: AC
Start: 2024-01-28 — End: 2024-02-07

## 2024-01-28 MED ORDER — HYDROCODONE-ACETAMINOPHEN 5-325 MG PO TABS
ORAL_TABLET | ORAL | Status: AC
  Filled 2024-01-28: qty 1

## 2024-01-28 MED ORDER — HYDROCODONE-ACETAMINOPHEN 5-325 MG PO TABS
1.0000 | ORAL_TABLET | Freq: Once | ORAL | Status: AC
  Administered 2024-01-28: 1 via ORAL

## 2024-01-28 NOTE — Discharge Instructions (Signed)
 You may take 400 mg of ibuprofen  with 1000 mg of Tylenol every 8 hours as needed for pain.  Be sure to keep dental appointment.  Be sure to complete antibiotics in their entirety.

## 2024-01-28 NOTE — ED Triage Notes (Signed)
 Patient reports he has a toothache to left upper (back) row that began about 2-3 weeks ago.  Home interventions: tylenol (not helpful)

## 2024-01-28 NOTE — ED Provider Notes (Signed)
 [facility]    CSN: [id number] Arrival date & time: 01/28/24  1033      History   Chief Complaint Chief Complaint  Patient presents with   Dental Pain    HPI Joshua Silva is a 33 y.o. male.   Patient presents today due to 2 weeks worth of dental pain that has worsened.  Patient states that he was originally taking Tylenol for pain with relief but states that he has not been able to get relief of pain with use of Tylenol or use of ibuprofen .  Patient states that he has a dental appointment scheduled for this Thursday.   Dental Pain   Past Medical History:  Diagnosis Date   Anxiety    Herpes    High blood pressure    Panic attacks     Patient Active Problem List   Diagnosis Date Noted   Essential hypertension 03/24/2022   Acute pain of left shoulder 03/24/2022    History reviewed. No pertinent surgical history.     Home Medications    Prior to Admission medications   Medication Sig Start Date End Date Taking? Authorizing Provider  amoxicillin-clavulanate (AUGMENTIN) 875-125 MG tablet Take 1 tablet by mouth every 12 (twelve) hours for 10 days. 01/28/24 02/07/24 Yes Andra Krabbe C, PA-C  methocarbamol  (ROBAXIN ) 500 MG tablet Take 1 tablet (500 mg total) by mouth 2 (two) times daily. 08/30/23   Raspet, Erin K, PA-C  naproxen  (NAPROSYN ) 500 MG tablet Take 1 tablet (500 mg total) by mouth 2 (two) times daily. 08/30/23   Raspet, Rocky POUR, PA-C    Family History History reviewed. No pertinent family history.  Social History Social History   Tobacco Use   Smoking status: Every Day    Types: Cigars   Smokeless tobacco: Never   Tobacco comments:    not tobacco  Vaping Use   Vaping status: Never Used  Substance Use Topics   Alcohol use: Yes    Alcohol/week: 2.0 - 3.0 standard drinks of alcohol    Types: 2 - 3 Cans of beer per week    Comment: daily   Drug use: Yes    Types: Marijuana     Allergies   Patient has no known  allergies.   Review of Systems Review of Systems   Physical Exam Triage Vital Signs ED Triage Vitals  Encounter Vitals Group     BP 01/28/24 1218 (!) 155/102     Girls Systolic BP Percentile --      Girls Diastolic BP Percentile --      Boys Systolic BP Percentile --      Boys Diastolic BP Percentile --      Pulse Rate 01/28/24 1218 70     Resp 01/28/24 1218 18     Temp 01/28/24 1218 98.5 F (36.9 C)     Temp Source 01/28/24 1218 Oral     SpO2 01/28/24 1218 97 %     Weight --      Height --      Head Circumference --      Peak Flow --      Pain Score 01/28/24 1217 10     Pain Loc --      Pain Education --      Exclude from Growth Chart --    No data found.  Updated Vital Signs BP (!) 151/103 (BP Location: Left Arm) Comment: Provider made aware  Pulse 70   Temp 98.5 F (36.9 C) (Oral)  Resp 18   SpO2 97%   Visual Acuity Right Eye Distance:   Left Eye Distance:   Bilateral Distance:    Right Eye Near:   Left Eye Near:    Bilateral Near:     Physical Exam Vitals and nursing note reviewed.  Constitutional:      General: He is not in acute distress.    Appearance: Normal appearance. He is not ill-appearing, toxic-appearing or diaphoretic.  HENT:     Mouth/Throat:      Comments: Gingival tenderness and abscess noted of upper left quadrant where indicated on diagram Eyes:     General: No scleral icterus. Cardiovascular:     Rate and Rhythm: Normal rate and regular rhythm.     Heart sounds: Normal heart sounds.  Pulmonary:     Effort: Pulmonary effort is normal. No respiratory distress.     Breath sounds: Normal breath sounds. No wheezing or rhonchi.  Skin:    General: Skin is warm.  Neurological:     Mental Status: He is alert and oriented to person, place, and time.  Psychiatric:        Mood and Affect: Mood normal.        Behavior: Behavior normal.      UC Treatments / Results  Labs (all labs ordered are listed, but only abnormal results are  displayed) Labs Reviewed - No data to display  EKG   Radiology No results found.  Procedures Procedures (including critical care time)  Medications Ordered in UC Medications  HYDROcodone-acetaminophen (NORCO/VICODIN) 5-325 MG per tablet 1 tablet (1 tablet Oral Given 01/28/24 1239)    Initial Impression / Assessment and Plan / UC Course  I have reviewed the triage vital signs and the nursing notes.  Pertinent labs & imaging results that were available during my care of the patient were reviewed by me and considered in my medical decision making (see chart for details).  Clinical Course as of 01/28/24 1258  Sun Jan 28, 2024  1252 149/104 [SP]    Clinical Course User Index [SP] Andra Corean BROCKS, PA-C    Dental abscess-patient was given Norco in office with relief.  Patient was prescribed Augmentin twice daily for 10 days, patient advised to keep his dental appointment on Thursday Final Clinical Impressions(s) / UC Diagnoses   Final diagnoses:  Dental abscess     Discharge Instructions      You may take 400 mg of ibuprofen  with 1000 mg of Tylenol every 8 hours as needed for pain.  Be sure to keep dental appointment.  Be sure to complete antibiotics in their entirety.    ED Prescriptions     Medication Sig Dispense Auth. Provider   amoxicillin-clavulanate (AUGMENTIN) 875-125 MG tablet Take 1 tablet by mouth every 12 (twelve) hours for 10 days. 20 tablet Andra Corean BROCKS, PA-C      PDMP not reviewed this encounter.   Andra Corean BROCKS, PA-C 01/28/24 1258

## 2024-03-28 ENCOUNTER — Encounter (HOSPITAL_COMMUNITY): Payer: Self-pay

## 2024-03-28 ENCOUNTER — Ambulatory Visit (HOSPITAL_COMMUNITY)
Admission: EM | Admit: 2024-03-28 | Discharge: 2024-03-28 | Disposition: A | Discharge status: Home / Self Care | Payer: Self-pay | Source: Home / Self Care

## 2024-03-28 DIAGNOSIS — Z9189 Other specified personal risk factors, not elsewhere classified: Secondary | ICD-10-CM | POA: Insufficient documentation

## 2024-03-28 DIAGNOSIS — I10 Essential (primary) hypertension: Secondary | ICD-10-CM | POA: Insufficient documentation

## 2024-03-28 NOTE — ED Triage Notes (Signed)
 Patient is requesting STD testing. Patient denies any symptoms.

## 2024-03-28 NOTE — Discharge Instructions (Signed)
 STD testing pending, this will take 2-3 days to result. We will only call you if your testing is positive for any infection(s) and we will provide treatment.  Avoid sexual intercourse until your STD results come back.  If any of your STD results are positive, you will need to avoid sexual intercourse for 7 days while you are being treated to prevent spread of STD.  Condom use is the best way to prevent spread of STDs. Notify partner(s) of any positive results.  Return to urgent care as needed.   Your BP was elevated here. Follow-up with PCP as planned regarding elevated BP reading in clinic.  Limit your salt intake, exercise 2-3 times per week to lower your BP naturally.  If you develop headache, chest pain, dizziness, vision changes, go to the ER.

## 2024-03-28 NOTE — ED Provider Notes (Signed)
 [facility]    CSN: [id number] Arrival date & time: 03/28/24  1629      History   Chief Complaint No chief complaint on file.   HPI Joshua Silva is a 33 y.o. male.   Joshua Silva is a 33 y.o. male presenting to urgent care requesting STI testing.  Currently asymptomatic and without recent known STI exposure.  Sexually active with 1-2 male partners unprotected within the last month. Denies urinary symptoms, N/V/D, pelvic/abdominal pain, new low back pain, fever/chills.  No penile discharge, odor, or itching. No rashes to the GU area.   BP elevated in triage at 140/84. History of HTN, does not take meds currently. Does not have PCP, is working on establishing with PCP in January 2026 once insurance kicks in. Denies CP, SOB, palpitations, dizziness, extremity weakness, headache, vision changes, and paresthesias.      Past Medical History:  Diagnosis Date   Anxiety    Herpes    High blood pressure    Panic attacks     Patient Active Problem List   Diagnosis Date Noted   Essential hypertension 03/24/2022   Acute pain of left shoulder 03/24/2022    History reviewed. No pertinent surgical history.     Home Medications    Prior to Admission medications  Medication Sig Start Date End Date Taking? Authorizing Provider  methocarbamol  (ROBAXIN ) 500 MG tablet Take 1 tablet (500 mg total) by mouth 2 (two) times daily. Patient not taking: Reported on 03/28/2024 08/30/23   Raspet, Erin K, PA-C  naproxen  (NAPROSYN ) 500 MG tablet Take 1 tablet (500 mg total) by mouth 2 (two) times daily. Patient not taking: Reported on 03/28/2024 08/30/23   Raspet, Rocky POUR, PA-C    Family History History reviewed. No pertinent family history.  Social History Social History[1]   Allergies   Patient has no known allergies.   Review of Systems Review of Systems Per HPI  Physical Exam Triage Vital Signs ED Triage Vitals [03/28/24 1647]  Encounter Vitals Group      BP (!) 140/84     Girls Systolic BP Percentile      Girls Diastolic BP Percentile      Boys Systolic BP Percentile      Boys Diastolic BP Percentile      Pulse Rate 85     Resp 16     Temp 98.4 F (36.9 C)     Temp Source Oral     SpO2 97 %     Weight      Height      Head Circumference      Peak Flow      Pain Score 0     Pain Loc      Pain Education      Exclude from Growth Chart    No data found.  Updated Vital Signs BP (!) 140/84 (BP Location: Left Arm)   Pulse 85   Temp 98.4 F (36.9 C) (Oral)   Resp 16   SpO2 97%   Visual Acuity Right Eye Distance:   Left Eye Distance:   Bilateral Distance:    Right Eye Near:   Left Eye Near:    Bilateral Near:     Physical Exam Vitals and nursing note reviewed.  Constitutional:      Appearance: He is not ill-appearing or toxic-appearing.  HENT:     Head: Normocephalic and atraumatic.     Right Ear: Hearing and external ear normal.  Left Ear: Hearing and external ear normal.     Nose: Nose normal.     Mouth/Throat:     Lips: Pink.  Eyes:     General: Lids are normal. Vision grossly intact. Gaze aligned appropriately.     Extraocular Movements: Extraocular movements intact.     Conjunctiva/sclera: Conjunctivae normal.  Pulmonary:     Effort: Pulmonary effort is normal.  Musculoskeletal:     Cervical back: Neck supple.  Skin:    General: Skin is warm and dry.     Capillary Refill: Capillary refill takes less than 2 seconds.     Findings: No rash.  Neurological:     General: No focal deficit present.     Mental Status: He is alert and oriented to person, place, and time. Mental status is at baseline.     Cranial Nerves: No dysarthria or facial asymmetry.  Psychiatric:        Mood and Affect: Mood normal.        Speech: Speech normal.        Behavior: Behavior normal.        Thought Content: Thought content normal.        Judgment: Judgment normal.      UC Treatments / Results  Labs (all labs ordered  are listed, but only abnormal results are displayed) Labs Reviewed  CYTOLOGY, (ORAL, ANAL, URETHRAL) ANCILLARY ONLY    EKG   Radiology No results found.  Procedures Procedures (including critical care time)  Medications Ordered in UC Medications - No data to display  Initial Impression / Assessment and Plan / UC Course  I have reviewed the triage vital signs and the nursing notes.  Pertinent labs & imaging results that were available during my care of the patient were reviewed by me and considered in my medical decision making (see chart for details).   1.  At risk for STD due to unprotected sex STI labs pending, will notify patient of positive results and treat accordingly per protocol when labs result.  Patient declines HIV and syphilis testing today.  He just have this performed 2 months ago in October. Patient to avoid sexual intercourse until screening testing comes back.   Education provided regarding safe sexual practices and patient encouraged to use protection to prevent spread of STIs.    2.  Elevated blood pressure reading in office with diagnosis of hypertension Patient does not currently take any blood pressure medications. He has had normal readings in the past 2 to 3 months when he has been at the clinic without pain.  He plans on establishing care with a PCP in January 2026. I would like for him to continue monitoring his blood pressure at home and attempt to reduce his salt intake/increase his exercise to naturally lower blood pressure.  We agreed that starting a medication today is not indicated. We have discussed ER precautions should he have elevated blood pressure with symptoms.   Counseled patient on potential for adverse effects with medications prescribed/recommended today, strict ER and return-to-clinic precautions discussed, patient verbalized understanding.    Final Clinical Impressions(s) / UC Diagnoses   Final diagnoses:  At risk for sexually  transmitted disease due to unprotected sex  Elevated blood pressure reading in office with diagnosis of hypertension     Discharge Instructions      STD testing pending, this will take 2-3 days to result. We will only call you if your testing is positive for any infection(s) and we will  provide treatment.  Avoid sexual intercourse until your STD results come back.  If any of your STD results are positive, you will need to avoid sexual intercourse for 7 days while you are being treated to prevent spread of STD.  Condom use is the best way to prevent spread of STDs. Notify partner(s) of any positive results.  Return to urgent care as needed.   Your BP was elevated here. Follow-up with PCP as planned regarding elevated BP reading in clinic.  Limit your salt intake, exercise 2-3 times per week to lower your BP naturally.  If you develop headache, chest pain, dizziness, vision changes, go to the ER.     ED Prescriptions   None    PDMP not reviewed this encounter.    [1]  Social History Tobacco Use   Smoking status: Every Day    Types: Cigars   Smokeless tobacco: Never   Tobacco comments:    not tobacco  Vaping Use   Vaping status: Never Used  Substance Use Topics   Alcohol use: Yes    Alcohol/week: 2.0 - 3.0 standard drinks of alcohol    Types: 2 - 3 Cans of beer per week    Comment: daily   Drug use: Yes    Types: Marijuana     Enedelia Dorna HERO, FNP 03/28/24 1729

## 2024-03-29 ENCOUNTER — Ambulatory Visit (HOSPITAL_COMMUNITY): Payer: Self-pay

## 2024-03-29 LAB — CYTOLOGY, (ORAL, ANAL, URETHRAL) ANCILLARY ONLY
Chlamydia: NEGATIVE
Comment: NEGATIVE
Comment: NEGATIVE
Comment: NORMAL
Neisseria Gonorrhea: NEGATIVE
Trichomonas: POSITIVE — AB

## 2024-03-29 MED ORDER — METRONIDAZOLE 500 MG PO TABS: 2000.0000 mg | ORAL_TABLET | Freq: Once | ORAL | 0 refills | Status: AC

## 2024-05-14 ENCOUNTER — Ambulatory Visit (HOSPITAL_COMMUNITY)
Admission: EM | Admit: 2024-05-14 | Discharge: 2024-05-14 | Disposition: A | Discharge status: Home / Self Care | Payer: Self-pay | Source: Home / Self Care

## 2024-05-14 ENCOUNTER — Encounter (HOSPITAL_COMMUNITY): Payer: Self-pay

## 2024-05-14 DIAGNOSIS — R030 Elevated blood-pressure reading, without diagnosis of hypertension: Secondary | ICD-10-CM

## 2024-05-14 DIAGNOSIS — Z113 Encounter for screening for infections with a predominantly sexual mode of transmission: Secondary | ICD-10-CM

## 2024-05-14 LAB — HIV ANTIBODY (ROUTINE TESTING W REFLEX): HIV Screen 4th Generation wRfx: NONREACTIVE

## 2024-05-14 NOTE — ED Notes (Signed)
 At bedside for patient evaluation

## 2024-05-14 NOTE — Discharge Instructions (Addendum)
 We will contact you if any of the testing comes back abnormal.  We are testing you today for gonorrhea, chlamydia, trichomonas, HIV, and syphilis.  Recommend condom use with every sexual encounter to prevent STI.   Please follow up with a PCP regarding the blood pressure being elevated.

## 2024-05-15 ENCOUNTER — Encounter (HOSPITAL_COMMUNITY): Payer: Self-pay | Admitting: *Deleted

## 2024-05-15 ENCOUNTER — Ambulatory Visit (HOSPITAL_COMMUNITY)
Admission: EM | Admit: 2024-05-15 | Discharge: 2024-05-15 | Disposition: A | Discharge status: Home / Self Care | Payer: Self-pay | Source: Home / Self Care | Attending: Family Medicine | Admitting: Family Medicine

## 2024-05-15 ENCOUNTER — Ambulatory Visit (HOSPITAL_COMMUNITY): Payer: Self-pay

## 2024-05-15 DIAGNOSIS — K0889 Other specified disorders of teeth and supporting structures: Secondary | ICD-10-CM

## 2024-05-15 LAB — CYTOLOGY, (ORAL, ANAL, URETHRAL) ANCILLARY ONLY
Chlamydia: NEGATIVE
Comment: NEGATIVE
Comment: NEGATIVE
Comment: NORMAL
Neisseria Gonorrhea: NEGATIVE
Trichomonas: POSITIVE — AB

## 2024-05-15 LAB — SYPHILIS: RPR W/REFLEX TO RPR TITER AND TREPONEMAL ANTIBODIES, TRADITIONAL SCREENING AND DIAGNOSIS ALGORITHM: RPR Ser Ql: NONREACTIVE

## 2024-05-15 MED ORDER — METRONIDAZOLE 500 MG PO TABS: 2000.0000 mg | ORAL_TABLET | Freq: Two times a day (BID) | ORAL | 0 refills | Status: AC

## 2024-05-15 MED ORDER — AMOXICILLIN 875 MG PO TABS: 875.0000 mg | ORAL_TABLET | Freq: Two times a day (BID) | ORAL | 0 refills | Status: AC

## 2024-05-15 MED ORDER — HYDROCODONE-ACETAMINOPHEN 5-325 MG PO TABS: 1.0000 | ORAL_TABLET | Freq: Four times a day (QID) | ORAL | 0 refills | Status: AC | PRN

## 2024-05-15 NOTE — ED Triage Notes (Signed)
 Pt states he has right upper and lower dental pain X 2 days. He states he has been seen in the past for this. He has been taking advil , which is helping pain.

## 2024-05-15 NOTE — Medical Student Note (Signed)
 [employer], Site Note For educational purposes for Medical, PA and NP students only and not part of the legal medical record.   CSN: [id number] Arrival date & time: 05/15/24  1649      History   Chief Complaint Chief Complaint  Patient presents with   Dental Pain    HPI Joshua Silva is a 34 y.o. male with a hx of anxiety and HTN.  He presents with a 2 day hx of severe dental pain to the R side of his face. He reports he has some teeth in the upper and lower portion that the dentist told him will need a root canal. He denies fever or chills.    The history is provided by the patient.  Dental Pain Associated symptoms: no fever     Past Medical History:  Diagnosis Date   Anxiety    Herpes    High blood pressure    Panic attacks     Patient Active Problem List   Diagnosis Date Noted   Essential hypertension 03/24/2022   Acute pain of left shoulder 03/24/2022    History reviewed. No pertinent surgical history.     Home Medications    Prior to Admission medications  Medication Sig Start Date End Date Taking? Authorizing Provider  metroNIDAZOLE  (FLAGYL ) 500 MG tablet Take 4 tablets (2,000 mg total) by mouth 2 (two) times daily for 1 dose. 05/15/24 05/16/24  Vonna Sharlet POUR, MD    Family History Family History  Problem Relation Age of Onset   Diabetes Mother    Hypertension Mother    Hypertension Father    Diabetes Father     Social History Social History[1]   Allergies   Patient has no known allergies.   Review of Systems Review of Systems  Constitutional:  Negative for chills and fever.  All other systems reviewed and are negative.    Physical Exam Updated Vital Signs BP (!) 145/94 (BP Location: Right Arm)   Pulse 85   Temp 98 F (36.7 C) (Oral)   Resp 16   SpO2 95%   Physical Exam Constitutional:      Appearance: Normal appearance.  HENT:     Mouth/Throat:     Dentition: Gingival swelling and dental caries  present.     Pharynx: Posterior oropharyngeal erythema present.     Comments: Gingival swelling and erythema present around the lower R 1st molar without obvious fluctuance. Multiple carries and halitosis present. Cardiovascular:     Rate and Rhythm: Normal rate and regular rhythm.     Pulses: Normal pulses.     Heart sounds: Normal heart sounds.  Pulmonary:     Effort: Pulmonary effort is normal.     Breath sounds: Normal breath sounds.  Neurological:     Mental Status: He is alert.      ED Treatments / Results  Labs (all labs ordered are listed, but only abnormal results are displayed) Labs Reviewed - No data to display  EKG  Radiology No results found.  Procedures Procedures (including critical care time)  Medications Ordered in ED Medications - No data to display   Initial Impression / Assessment and Plan / ED Course  I have reviewed the triage vital signs and the nursing notes.  Pertinent labs & imaging results that were available during my care of the patient were reviewed by me and considered in my medical decision making (see chart for details).    Dental Pain  Will  treat for possible early periapical abscess. Start Amoxicillin  875 mg PO BID for 7 days. Norco 5-325 mg one tablet two times daily as needed for pain. Pt encouraged to follow up with dentist as soon as possible to have teeth pulled or root canal performed to prevent recurrent infections.   Red flag symptoms reviewed and return precautions given.    Final Clinical Impressions(s) / ED Diagnoses   Final diagnoses:  None    New Prescriptions New Prescriptions   No medications on file       [1]  Social History Tobacco Use   Smoking status: Every Day    Types: Cigars   Smokeless tobacco: Never   Tobacco comments:    not tobacco  Vaping Use   Vaping status: Never Used  Substance Use Topics   Alcohol use: Yes    Alcohol/week: 2.0 - 3.0 standard drinks of alcohol    Types: 2 - 3  Cans of beer per week    Comment: daily   Drug use: Yes    Types: Marijuana   "

## 2024-05-15 NOTE — Discharge Instructions (Signed)
 Be aware, you have been prescribed pain medications that may cause drowsiness. While taking this medication, do not take any other medications containing acetaminophen (Tylenol). Do not combine with alcohol or recreational drugs. Please do not drive, operate heavy machinery, or take part in activities that require making important decisions while on this medication as your judgement may be clouded.
